# Patient Record
Sex: Female | Born: 2000 | Race: White | Hispanic: No | Marital: Single | Smoking: Never smoker
Health system: Southern US, Community
[De-identification: ages and names within clinical notes are randomized; demographics above are authoritative.]

## PROBLEM LIST (undated history)

## (undated) DIAGNOSIS — D693 Immune thrombocytopenic purpura: Secondary | ICD-10-CM

## (undated) DIAGNOSIS — I471 Supraventricular tachycardia, unspecified: Secondary | ICD-10-CM

## (undated) DIAGNOSIS — N390 Urinary tract infection, site not specified: Secondary | ICD-10-CM

## (undated) HISTORY — DX: Urinary tract infection, site not specified: N39.0

## (undated) HISTORY — PX: CARDIAC ELECTROPHYSIOLOGY STUDY AND ABLATION: SHX1294

---
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
## 2020-10-29 ENCOUNTER — Ambulatory Visit: Payer: No Typology Code available for payment source | Admitting: Dermatology

## 2020-11-26 ENCOUNTER — Ambulatory Visit (INDEPENDENT_AMBULATORY_CARE_PROVIDER_SITE_OTHER): Payer: BC Managed Care – PPO | Admitting: Family Medicine

## 2020-11-26 ENCOUNTER — Encounter: Payer: Self-pay | Admitting: Family Medicine

## 2020-11-26 ENCOUNTER — Other Ambulatory Visit: Payer: Self-pay

## 2020-11-26 VITALS — BP 117/82 | HR 101 | Temp 98.3°F | Resp 16 | Ht 63.0 in | Wt 121.0 lb

## 2020-11-26 DIAGNOSIS — I471 Supraventricular tachycardia: Secondary | ICD-10-CM | POA: Diagnosis not present

## 2020-11-26 DIAGNOSIS — Z862 Personal history of diseases of the blood and blood-forming organs and certain disorders involving the immune mechanism: Secondary | ICD-10-CM

## 2020-11-26 NOTE — Progress Notes (Signed)
New patient visit   Patient: Destiny Phillips   DOB: 12-Nov-2000   19 y.o. Female  MRN: [id number] Visit Date: 11/26/2020  Today's healthcare provider: Megan Mans, MD   Chief Complaint  Patient presents with  . New Patient (Initial Visit)   Subjective    Destiny Phillips is a 20 y.o. female who presents today as a new patient to establish care. 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 She has not had the COVID-vaccine but had COVID in January. 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 She was seen by her dentist 1 week ago when for an oral rash in the roof of her mouth.  She is worried it is ITP.  No easy bruising.  Otherwise feels well.  Patient feels well today with no complaints.   No past medical history on file. No past surgical history on file. Family Status  Relation Name Status  . Mother  Alive  . Father  Alive  . Oneal Grout  (Not Specified)  . MGM  (Not Specified)  . PGF  (Not Specified)   Family History  Problem Relation Age of Onset  . Healthy Mother   . Healthy Father   . Cancer Paternal Uncle   . Breast cancer Maternal Grandmother   . Colon cancer Paternal Grandfather    Social History   Socioeconomic History  . Marital status: Single    Spouse name: Not on file  . Number of children: Not on file  . Years of education: Not on file  . Highest education level: Not on file  Occupational History  . Not on file  Tobacco Use  . Smoking status: Never Smoker  . Smokeless tobacco: Never Used  Substance and Sexual Activity  . Alcohol use: Not on file  . Drug use: Never  . Sexual activity: Not on file  Other Topics Concern  . Not on file  Social History Narrative  . Not on file   Social Determinants of Health   Financial Resource Strain: Not on file  Food Insecurity: Not on file  Transportation Needs: Not on file  Physical Activity: Not on  file  Stress: Not on file  Social Connections: Not on file   Outpatient Medications Prior to Visit  Medication Sig  . acetaminophen (TYLENOL) 325 MG tablet Take 325 mg by mouth every 6 (six) hours as needed for pain.  Marland Kitchen atenolol (TENORMIN) 25 MG tablet Take 1 tablet (25 mg total) by mouth daily. (Patient not taking: Reported on 11/26/2020)   No facility-administered medications prior to visit.   No Known Allergies   There is no immunization history on file for this patient.  Health Maintenance  Topic Date Due  . HPV VACCINES (1 - 2-dose series) Never done  . HIV Screening  Never done  . Hepatitis C Screening  Never done  . TETANUS/TDAP  Never done  . INFLUENZA VACCINE  02/16/2021    Patient Care Team: Maple Hudson., MD as PCP - General (Family Medicine)  Review of Systems  All other systems reviewed and are negative.      Objective    BP 117/82   Pulse (!) 101   Temp 98.3 F (36.8 C)   Resp 16   Ht 5\' 3"  (1.6 m)   Wt 121 lb (54.9 kg)   LMP 11/19/2020  BMI 21.43 kg/m  Physical Exam Vitals reviewed.  Constitutional:      Appearance: Normal appearance.  HENT:     Head: Normocephalic and atraumatic.     Right Ear: External ear normal.     Left Ear: External ear normal.     Nose: Nose normal.     Mouth/Throat:     Mouth: Mucous membranes are moist.     Comments: Mildly erythematous small lesions at the roof of her mouth. Eyes:     General: No scleral icterus.    Conjunctiva/sclera: Conjunctivae normal.  Cardiovascular:     Rate and Rhythm: Normal rate and regular rhythm.     Heart sounds: Normal heart sounds.  Pulmonary:     Breath sounds: Normal breath sounds.  Abdominal:     Palpations: Abdomen is soft.  Skin:    General: Skin is warm and dry.  Neurological:     General: No focal deficit present.     Mental Status: She is alert and oriented to person, place, and time.  Psychiatric:        Mood and Affect: Mood normal.        Behavior:  Behavior normal.        Thought Content: Thought content normal.        Judgment: Judgment normal.      Depression Screen PHQ 2/9 Scores 11/26/2020  PHQ - 2 Score 0  PHQ- 9 Score 4   No results found for any visits on 11/26/20.  Assessment & Plan      1. History of ITP May need referral to hematology if his platelets are low. - CBC with Differential/Platelet  2. SVT (supraventricular tachycardia) (HCC) Asymptomatic.   No follow-ups on file.     I, Megan Mans, MD, have reviewed all documentation for this visit. The documentation on 11/29/20 for the exam, diagnosis, procedures, and orders are all accurate and complete.    Viva Gallaher Wendelyn Breslow, MD  [facility] [phone] (phone) [phone] (fax)  [facility]

## 2020-11-27 LAB — CBC WITH DIFFERENTIAL/PLATELET
Basophils Absolute: 0 10*3/uL (ref 0.0–0.2)
Basos: 1 %
EOS (ABSOLUTE): 0.1 10*3/uL (ref 0.0–0.4)
Eos: 2 %
Hematocrit: 36.5 % (ref 34.0–46.6)
Hemoglobin: 11.7 g/dL (ref 11.1–15.9)
Immature Grans (Abs): 0 10*3/uL (ref 0.0–0.1)
Immature Granulocytes: 0 %
Lymphocytes Absolute: 1.5 10*3/uL (ref 0.7–3.1)
Lymphs: 33 %
MCH: 25 pg — ABNORMAL LOW (ref 26.6–33.0)
MCHC: 32.1 g/dL (ref 31.5–35.7)
MCV: 78 fL — ABNORMAL LOW (ref 79–97)
Monocytes Absolute: 0.4 10*3/uL (ref 0.1–0.9)
Monocytes: 8 %
Neutrophils Absolute: 2.6 10*3/uL (ref 1.4–7.0)
Neutrophils: 56 %
Platelets: 137 10*3/uL — ABNORMAL LOW (ref 150–450)
RBC: 4.68 x10E6/uL (ref 3.77–5.28)
RDW: 14.4 % (ref 11.7–15.4)
WBC: 4.6 10*3/uL (ref 3.4–10.8)

## 2020-12-02 ENCOUNTER — Other Ambulatory Visit: Payer: Self-pay

## 2020-12-02 DIAGNOSIS — Z862 Personal history of diseases of the blood and blood-forming organs and certain disorders involving the immune mechanism: Secondary | ICD-10-CM

## 2020-12-02 MED ORDER — PREDNISONE 10 MG PO TABS: ORAL_TABLET | ORAL | 0 refills | Status: DC

## 2020-12-11 ENCOUNTER — Emergency Department (HOSPITAL_COMMUNITY)
Admission: EM | Admit: 2020-12-11 | Discharge: 2020-12-12 | Disposition: A | Discharge status: Home / Self Care | Payer: BC Managed Care – PPO | Attending: Emergency Medicine | Admitting: Emergency Medicine

## 2020-12-11 ENCOUNTER — Ambulatory Visit: Payer: Self-pay | Admitting: *Deleted

## 2020-12-11 ENCOUNTER — Emergency Department (HOSPITAL_COMMUNITY): Payer: BC Managed Care – PPO

## 2020-12-11 ENCOUNTER — Other Ambulatory Visit: Payer: Self-pay

## 2020-12-11 ENCOUNTER — Encounter (HOSPITAL_COMMUNITY): Payer: Self-pay | Admitting: *Deleted

## 2020-12-11 DIAGNOSIS — R070 Pain in throat: Secondary | ICD-10-CM | POA: Diagnosis not present

## 2020-12-11 DIAGNOSIS — R410 Disorientation, unspecified: Secondary | ICD-10-CM | POA: Diagnosis not present

## 2020-12-11 DIAGNOSIS — Z28310 Unvaccinated for covid-19: Secondary | ICD-10-CM | POA: Insufficient documentation

## 2020-12-11 DIAGNOSIS — R079 Chest pain, unspecified: Secondary | ICD-10-CM

## 2020-12-11 DIAGNOSIS — R0602 Shortness of breath: Secondary | ICD-10-CM | POA: Diagnosis not present

## 2020-12-11 DIAGNOSIS — R0600 Dyspnea, unspecified: Secondary | ICD-10-CM

## 2020-12-11 HISTORY — DX: Supraventricular tachycardia, unspecified: I47.10

## 2020-12-11 HISTORY — DX: Supraventricular tachycardia: I47.1

## 2020-12-11 HISTORY — DX: Immune thrombocytopenic purpura: D69.3

## 2020-12-11 LAB — BASIC METABOLIC PANEL
Anion gap: 7 (ref 5–15)
BUN: 15 mg/dL (ref 6–20)
CO2: 28 mmol/L (ref 22–32)
Calcium: 9.4 mg/dL (ref 8.9–10.3)
Chloride: 99 mmol/L (ref 98–111)
Creatinine, Ser: 0.75 mg/dL (ref 0.44–1.00)
GFR, Estimated: >60 mL/min (ref >60)
Glucose, Bld: 90 mg/dL (ref 70–99)
Potassium: 3.8 mmol/L (ref 3.5–5.1)
Sodium: 134 mmol/L — ABNORMAL LOW (ref 135–145)

## 2020-12-11 LAB — CBC WITH DIFFERENTIAL/PLATELET
Abs Immature Granulocytes: 0.16 10*3/uL — ABNORMAL HIGH (ref 0.00–0.07)
Basophils Absolute: 0 10*3/uL (ref 0.0–0.1)
Basophils Relative: 1 %
Eosinophils Absolute: 0.1 10*3/uL (ref 0.0–0.5)
Eosinophils Relative: 1 %
HCT: 39.1 % (ref 36.0–46.0)
Hemoglobin: 12.4 g/dL (ref 12.0–15.0)
Immature Granulocytes: 2 %
Lymphocytes Relative: 36 %
Lymphs Abs: 3.1 10*3/uL (ref 0.7–4.0)
MCH: 25.4 pg — ABNORMAL LOW (ref 26.0–34.0)
MCHC: 31.7 g/dL (ref 30.0–36.0)
MCV: 80 fL (ref 80.0–100.0)
Monocytes Absolute: 0.7 10*3/uL (ref 0.1–1.0)
Monocytes Relative: 8 %
Neutro Abs: 4.6 10*3/uL (ref 1.7–7.7)
Neutrophils Relative %: 52 %
Platelets: 172 10*3/uL (ref 150–400)
RBC: 4.89 MIL/uL (ref 3.87–5.11)
RDW: 14.4 % (ref 11.5–15.5)
WBC: 8.6 10*3/uL (ref 4.0–10.5)
nRBC: 0 % (ref 0.0–0.2)

## 2020-12-11 LAB — I-STAT BETA HCG BLOOD, ED (MC, WL, AP ONLY): I-stat hCG, quantitative: <5 m[IU]/mL (ref <5)

## 2020-12-11 LAB — TROPONIN I (HIGH SENSITIVITY)
Troponin I (High Sensitivity): 3 ng/L (ref <18)
Troponin I (High Sensitivity): 3 ng/L (ref <18)

## 2020-12-11 LAB — D-DIMER, QUANTITATIVE: D-Dimer, Quant: <0.27 ug/mL-FEU (ref 0.00–0.50)

## 2020-12-11 NOTE — ED Provider Notes (Signed)
Emergency Medicine Provider Triage Evaluation Note  Destiny Phillips , a 20 y.o. female  was evaluated in triage.  Pt complains of shortness of breath, generalized weakness and chest pressure that started last night. She is currently on prednisone for itp. She has a h/o cardiac ablation for svt.   Denies leg pain/swelling, hemoptysis, recent surgery, recent long travel, hormone use, personal hx of cancer, or hx of DVT/PE.   Review of Systems  Positive: Sob, generalized weakness, chest pressure Negative: Cough, pleuritic pain  Physical Exam  BP (!) 127/97   Pulse (!) 109   Temp 98.6 F (37 C)   Resp 16   LMP 11/19/2020   SpO2 99%  Gen:   Awake, no distress   Resp:  Normal effort  MSK:   Moves extremities without difficulty  Other:  Tachycardic, regular rhythm  Medical Decision Making  Medically screening exam initiated at 7:00 PM.  Appropriate orders placed.  Roanne Haye was informed that the remainder of the evaluation will be completed by another provider, this initial triage assessment does not replace that evaluation, and the importance of remaining in the ED until their evaluation is complete.    Rayne Du 12/11/20 1904    Margarita Grizzle, MD 12/11/20 2241

## 2020-12-11 NOTE — Discharge Instructions (Addendum)
Your hemoglobin is normal today.  Your platelets are 170,000 (normal).  Chest x-Violia Knopf does not show infection or other causes of shortness of breath.  Please return if you are worse at any time.  Follow up with your doctor asap.

## 2020-12-11 NOTE — Telephone Encounter (Signed)
Pt reports SOB, onset last night. Reports "Feels like an allergic reaction but I don't know what to." HAs been taking prednisone taper.Also reports difficulty swallowing. Denies swelling, no fever. States SOB at rest, unable to lie flat last night. Advised ED, states will follow disposition, has driver.   Reason for Disposition . [1] MODERATE difficulty breathing (e.g., speaks in phrases, SOB even at rest, pulse 100-120) AND [2] NEW-onset or WORSE than normal  Answer Assessment - Initial Assessment Questions 1. RESPIRATORY STATUS: "Describe your breathing?" (e.g., wheezing, shortness of breath, unable to speak, severe coughing)      SOB 2. ONSET: "When did this breathing problem begin?"      LAst night 3. PATTERN "Does the difficult breathing come and go, or has it been constant since it started?"      constant 4. SEVERITY: "How bad is your breathing?" (e.g., mild, moderate, severe)    - MILD: No SOB at rest, mild SOB with walking, speaks normally in sentences, can lie down, no retractions, pulse < 100.    - MODERATE: SOB at rest, SOB with minimal exertion and prefers to sit, cannot lie down flat, speaks in phrases, mild retractions, audible wheezing, pulse 100-120.    - SEVERE: Very SOB at rest, speaks in single words, struggling to breathe, sitting hunched forward, retractions, pulse > 120      Moderate 5. RECURRENT SYMPTOM: "Have you had difficulty breathing before?" If Yes, ask: "When was the last time?" and "What happened that time?"       6. CARDIAC HISTORY: "Do you have any history of heart disease?" (e.g., heart attack, angina, bypass surgery, angioplasty)      *No Answer* 7. LUNG HISTORY: "Do you have any history of lung disease?"  (e.g., pulmonary embolus, asthma, emphysema)     *No Answer* 8. CAUSE: "What do you think is causing the breathing problem?"      "Allergic reaction but not sure what to" 9. OTHER SYMPTOMS: "Do you have any other symptoms? (e.g., dizziness, runny nose,  cough, chest pain, fever)     no 10. O2 SATURATION MONITOR:  "Do you use an oxygen saturation monitor (pulse oximeter) at home?" If Yes, "What is your reading (oxygen level) today?" "What is your usual oxygen saturation reading?" (e.g., 95%)       NA  Protocols used: BREATHING DIFFICULTY-A-AH

## 2020-12-11 NOTE — ED Provider Notes (Signed)
[facility] EMERGENCY DEPARTMENT Provider Note   CSN: [id number] Arrival date & time: 12/11/20  1839     History Chief Complaint  Patient presents with  . Shortness of Breath  . Chest Pain    Destiny Phillips is a 20 y.o. female.  HPI    20 year old female history of ITP, SVT, anxiety presents today complaining of shortness of breath that began last night around 9 PM.   states she had some feeling of fullness in her throat that was associated with this.  She feels like it may be her anxiety but has never really had dyspnea like this before.  She had COVID in mid January and has not been vaccinated.  She has not noted any fever, chills, or coughing.  She currently is still on prednisone for ITP.  She has had a cardiac ablation for SVT.  She denies any leg swelling, history of DVT or PE, hemoptysis, recent immobilization or hormone use. Past Medical History:  Diagnosis Date  . Idiopathic thrombocytopenic purpura (ITP) (HCC)   . SVT (supraventricular tachycardia) (HCC)     There are no problems to display for this patient.   History reviewed. No pertinent surgical history.   OB History   No obstetric history on file.     Family History  Problem Relation Age of Onset  . Healthy Mother   . Healthy Father   . Cancer Paternal Uncle   . Breast cancer Maternal Grandmother   . Colon cancer Paternal Grandfather     Social History   Tobacco Use  . Smoking status: Never Smoker  . Smokeless tobacco: Never Used  Substance Use Topics  . Alcohol use: Never  . Drug use: Never    Home Medications Prior to Admission medications   Medication Sig Start Date End Date Taking? Authorizing Provider  predniSONE (DELTASONE) 10 MG tablet 12 d taper, begin with 60 mg and decrease by 10 mg every other day Patient taking differently: Take 10 mg by mouth See admin instructions. 12 d taper, begin with 60 mg and decrease by 10 mg every other day 12/02/20  Yes Maple Hudson., MD  acetaminophen (TYLENOL) 325 MG tablet Take 325 mg by mouth every 6 (six) hours as needed for pain. Patient not taking: Reported on 12/11/2020    [provider]    Allergies    Patient has no known allergies.  Review of Systems   Review of Systems  All other systems reviewed and are negative.   Physical Exam Updated Vital Signs BP 121/85 (BP Location: Right Arm)   Pulse 98   Temp 98.2 F (36.8 C) (Oral)   Resp 14   LMP 11/19/2020   SpO2 99%   Physical Exam Vitals and nursing note reviewed.  Constitutional:      General: She is not in acute distress.    Appearance: She is well-developed. She is not ill-appearing.  HENT:     Head: Normocephalic.     Mouth/Throat:     Mouth: Mucous membranes are moist.  Eyes:     Pupils: Pupils are equal, round, and reactive to light.  Cardiovascular:     Rate and Rhythm: Normal rate and regular rhythm.  Pulmonary:     Effort: Pulmonary effort is normal.     Breath sounds: Normal breath sounds.  Abdominal:     General: Bowel sounds are normal.     Palpations: Abdomen is soft.  Musculoskeletal:  General: Normal range of motion.     Cervical back: Normal range of motion.  Skin:    General: Skin is warm and dry.     Capillary Refill: Capillary refill takes less than 2 seconds.  Neurological:     General: No focal deficit present.     Mental Status: She is alert. She is disoriented.  Psychiatric:        Mood and Affect: Mood normal.     ED Results / Procedures / Treatments   Labs (all labs ordered are listed, but only abnormal results are displayed) Labs Reviewed  CBC WITH DIFFERENTIAL/PLATELET - Abnormal; Notable for the following components:      Result Value   MCH 25.4 (*)    Abs Immature Granulocytes 0.16 (*)    All other components within normal limits  BASIC METABOLIC PANEL - Abnormal; Notable for the following components:   Sodium 134 (*)    All other components within normal limits  D-DIMER,  QUANTITATIVE  I-STAT BETA HCG BLOOD, ED ([facility], [facility], [facility] ONLY)  TROPONIN I (HIGH SENSITIVITY)  TROPONIN I (HIGH SENSITIVITY)    EKG EKG Interpretation  Date/Time:  Thursday Dec 11 2020 18:56:56 EDT Ventricular Rate:  102 PR Interval:  120 QRS Duration: 80 QT Interval:  324 QTC Calculation: 422 R Axis:   73 Text Interpretation: Sinus tachycardia Possible Anterior infarct , age undetermined Abnormal ECG Confirmed by Margarita Grizzle [phone]) on 12/11/2020 10:44:41 PM   Radiology DG Chest 2 View  Result Date: 12/11/2020 CLINICAL DATA:  Shortness of breath.  Chest heaviness. EXAM: CHEST - 2 VIEW COMPARISON:  07/17/2013 FINDINGS: The cardiomediastinal contours are normal. Mild bronchial thickening is new from prior. Pulmonary vasculature is normal. No consolidation, pleural effusion, or pneumothorax. Thoracolumbar scoliosis with mild progression. No acute osseous abnormalities are seen. IMPRESSION: 1. Mild bronchial thickening, new from prior, suggesting asthma or bronchitis. 2. Thoracolumbar scoliosis. Electronically Signed   By: Narda Rutherford M.D.   On: 12/11/2020 19:57    Procedures Procedures   Medications Ordered in ED Medications - No data to display  ED Course  I have reviewed the triage vital signs and the nursing notes.  Pertinent labs & imaging results that were available during my care of the patient were reviewed by me and considered in my medical decision making (see chart for details).    MDM Rules/Calculators/A&P                         20 year old female presents today with dyspnea.  She has history of ITP with normal platelets here in ED.  CBC and chemistry obtained without acute abnormality noted.  Patient borderline tachycardic and EKG with mild tachycardia.  Chest x-Afrika Brick is clear although mild bronchial thickening is noted by radiologist.  Patient does not have symptoms consistent with this with no wheezing and no recent history of infectious symptoms.  Given normal EKG,  chest x-Bernal Luhman, and labs, will add D-dimer.  If this is normal patient be cleared for discharge Discussed with Dr. Preston Fleeting who will d/c patient if d-dimer normal.   Final Clinical Impression(s) / ED Diagnoses Final diagnoses:  Dyspnea, unspecified type    Rx / DC Orders ED Discharge Orders    None       Margarita Grizzle, MD 12/11/20 2303

## 2020-12-11 NOTE — ED Triage Notes (Signed)
Pt reports having sob last night with chest pressure and generalized weakness. Denies cough or palpitations. No acute distress noted at triage. Recently taking prednisone for ITP.

## 2020-12-11 NOTE — ED Provider Notes (Signed)
Care assumed from Dr. Rosalia Hammers, patient with shortness of breath, borderline tachycardia with D-dimer pending.  D-dimer is normal.  She is discharged with instructions to follow-up with PCP.  Return precautions discussed.  Results for orders placed or performed during the hospital encounter of 12/11/20  CBC with Differential  Result Value Ref Range   WBC 8.6 4.0 - 10.5 K/uL   RBC 4.89 3.87 - 5.11 MIL/uL   Hemoglobin 12.4 12.0 - 15.0 g/dL   HCT 41.9 62.2 - 29.7 %   MCV 80.0 80.0 - 100.0 fL   MCH 25.4 (L) 26.0 - 34.0 pg   MCHC 31.7 30.0 - 36.0 g/dL   RDW 98.9 21.1 - 94.1 %   Platelets 172 150 - 400 K/uL   nRBC 0.0 0.0 - 0.2 %   Neutrophils Relative % 52 %   Neutro Abs 4.6 1.7 - 7.7 K/uL   Lymphocytes Relative 36 %   Lymphs Abs 3.1 0.7 - 4.0 K/uL   Monocytes Relative 8 %   Monocytes Absolute 0.7 0.1 - 1.0 K/uL   Eosinophils Relative 1 %   Eosinophils Absolute 0.1 0.0 - 0.5 K/uL   Basophils Relative 1 %   Basophils Absolute 0.0 0.0 - 0.1 K/uL   Immature Granulocytes 2 %   Abs Immature Granulocytes 0.16 (H) 0.00 - 0.07 K/uL  Basic metabolic panel  Result Value Ref Range   Sodium 134 (L) 135 - 145 mmol/L   Potassium 3.8 3.5 - 5.1 mmol/L   Chloride 99 98 - 111 mmol/L   CO2 28 22 - 32 mmol/L   Glucose, Bld 90 70 - 99 mg/dL   BUN 15 6 - 20 mg/dL   Creatinine, Ser 7.40 0.44 - 1.00 mg/dL   Calcium 9.4 8.9 - 81.4 mg/dL   GFR, Estimated >48 >18 mL/min   Anion gap 7 5 - 15  D-dimer, quantitative  Result Value Ref Range   D-Dimer, Quant <0.27 0.00 - 0.50 ug/mL-FEU  I-Stat beta hCG blood, ED  Result Value Ref Range   I-stat hCG, quantitative <5.0 <5 mIU/mL   Comment 3          Troponin I (High Sensitivity)  Result Value Ref Range   Troponin I (High Sensitivity) 3 <18 ng/L  Troponin I (High Sensitivity)  Result Value Ref Range   Troponin I (High Sensitivity) 3 <18 ng/L   DG Chest 2 View  Result Date: 12/11/2020 CLINICAL DATA:  Shortness of breath.  Chest heaviness. EXAM: CHEST - 2  VIEW COMPARISON:  07/17/2013 FINDINGS: The cardiomediastinal contours are normal. Mild bronchial thickening is new from prior. Pulmonary vasculature is normal. No consolidation, pleural effusion, or pneumothorax. Thoracolumbar scoliosis with mild progression. No acute osseous abnormalities are seen. IMPRESSION: 1. Mild bronchial thickening, new from prior, suggesting asthma or bronchitis. 2. Thoracolumbar scoliosis. Electronically Signed   By: Narda Rutherford M.D.   On: 12/11/2020 19:57     Dione Booze, MD 12/12/20 [phone]

## 2020-12-12 NOTE — Telephone Encounter (Signed)
Noted  

## 2020-12-25 ENCOUNTER — Ambulatory Visit (INDEPENDENT_AMBULATORY_CARE_PROVIDER_SITE_OTHER): Payer: BC Managed Care – PPO | Admitting: Dermatology

## 2020-12-25 ENCOUNTER — Encounter: Payer: Self-pay | Admitting: Dermatology

## 2020-12-25 ENCOUNTER — Other Ambulatory Visit: Payer: Self-pay

## 2020-12-25 DIAGNOSIS — L281 Prurigo nodularis: Secondary | ICD-10-CM

## 2020-12-25 DIAGNOSIS — L308 Other specified dermatitis: Secondary | ICD-10-CM | POA: Diagnosis not present

## 2020-12-25 MED ORDER — CLOBETASOL PROPIONATE 0.05 % EX OINT: 1.0000 "application " | TOPICAL_OINTMENT | Freq: Two times a day (BID) | CUTANEOUS | 1 refills | Status: DC

## 2020-12-25 NOTE — Progress Notes (Signed)
   New Patient Visit  Subjective  Destiny Phillips is a 20 y.o. female who presents for the following: Eczema (New patient here today for eczema at legs. Patient has had eczema her entire life and advises she has used rx creams in the past that did not work but she does not remember what they were. ).  Patient did have biopsy done at [facility] that showed eczema.  Patient has had allergy testing in the past. She eats gluten free and is vegetarian.   The following portions of the chart were reviewed this encounter and updated as appropriate:   Tobacco  Allergies  Meds  Problems  Med Hx  Surg Hx  Fam Hx       Review of Systems:  No other skin or systemic complaints except as noted in HPI or Assessment and Plan.  Objective  Well appearing patient in no apparent distress; mood and affect are within normal limits.  A focused examination was performed including legs, arms. Relevant physical exam findings are noted in the Assessment and Plan.  Right Thigh - Anterior Lichenified nodules and plaques  Bilateral Thighs Thin scaly erythematous patches   Assessment & Plan  Prurigo nodularis Right Thigh - Anterior  Intralesional steroid injection side effects were reviewed including thinning of the skin and discoloration, such as redness, lightening or darkening.   Intralesional injection - Right Thigh - Anterior Location: bilateral thighs  Informed Consent: Discussed risks (infection, pain, bleeding, bruising, thinning of the skin, loss of skin pigment, lack of resolution, and recurrence of lesion) and benefits of the procedure, as well as the alternatives. Informed consent was obtained. Preparation: The area was prepared a standard fashion.  Procedure Details: An intralesional injection was performed with Kenalog 5 mg/cc. 1 cc in total were injected.  Total number of injections: > 12  Plan: The patient was instructed on post-op care. Recommend OTC analgesia as needed  for pain.   Other eczema Bilateral Thighs  Chronic condition with duration of many years. Condition is bothersome to patient. Currently flared.  Start clobetasol 0.05% ointment twice daily for up to 2 weeks. Avoid applying to face, groin, and axilla. Use as directed. Risk of skin atrophy with long-term use reviewed.   Topical steroids (such as triamcinolone, fluocinolone, fluocinonide, mometasone, clobetasol, halobetasol, betamethasone, hydrocortisone) can cause thinning and lightening of the skin if they are used for too long in the same area. Your physician has selected the right strength medicine for your problem and area affected on the body. Please use your medication only as directed by your physician to prevent side effects.   Atopic dermatitis (eczema) is a chronic, relapsing, pruritic condition that can significantly affect quality of life. It is often associated with allergic rhinitis and/or asthma and can require treatment with topical medications, phototherapy, or in severe cases a biologic medication called Dupixent in older children and adults.     Related Medications clobetasol ointment (TEMOVATE) 0.05 % Apply 1 application topically 2 (two) times daily. Apply twice daily for up to 2 weeks as needed. Avoid applying to face, groin, and axilla. Use as directed. Risk of skin atrophy with long-term use reviewed.   Return for 3-6 weeks for prurigo nodularis/ILK injections.  Anise Salvo, RMA, am acting as scribe for Darden Dates, MD .  Documentation: I have reviewed the above documentation for accuracy and completeness, and I agree with the above.  Darden Dates, MD

## 2020-12-25 NOTE — Patient Instructions (Addendum)
Start clobetasol 0.05% ointment twice daily for up to 2 weeks. Avoid applying to face, groin, and axilla. Use as directed. Risk of skin atrophy with long-term use reviewed.   Topical steroids (such as triamcinolone, fluocinolone, fluocinonide, mometasone, clobetasol, halobetasol, betamethasone, hydrocortisone) can cause thinning and lightening of the skin if they are used for too long in the same area. Your physician has selected the right strength medicine for your problem and area affected on the body. Please use your medication only as directed by your physician to prevent side effects.   Intralesional steroid injection side effects were reviewed including thinning of the skin and discoloration, such as redness, lightening or darkening.  Recommend OTC Gold Bond Rapid Relief Anti-Itch cream (pramoxine + menthol) up to 3 times per day to areas that are itchy.  If you have any questions or concerns for your doctor, please call our main line at [phone] and press option 4 to reach your doctor's medical assistant. If no one answers, please leave a voicemail as directed and we will return your call as soon as possible. Messages left after 4 pm will be answered the following business day.   You may also send [place] a message via MyChart. We typically respond to MyChart messages within 1-2 business days.  For prescription refills, please ask your pharmacy to contact our office. Our fax number is [phone].  If you have an urgent issue when the clinic is closed that cannot wait until the next business day, you can page your doctor at the number below.    Please note that while we do our best to be available for urgent issues outside of office hours, we are not available 24/7.   If you have an urgent issue and are unable to reach [place], you may choose to seek medical care at your doctor's office, retail clinic, urgent care center, or emergency room.  If you have a medical emergency, please immediately  call 911 or go to the emergency department.  Pager Numbers  - Dr. Gwen Pounds: [phone]  - Dr. Neale Burly: [phone]  - Dr. Roseanne Reno: [phone]  In the event of inclement weather, please call our main line at [phone] for an update on the status of any delays or closures.  Dermatology Medication Tips: Please keep the boxes that topical medications come in in order to help keep track of the instructions about where and how to use these. Pharmacies typically print the medication instructions only on the boxes and not directly on the medication tubes.   If your medication is too expensive, please contact our office at [phone] option 4 or send [place] a message through MyChart.   We are unable to tell what your co-pay for medications will be in advance as this is different depending on your insurance coverage. However, we may be able to find a substitute medication at lower cost or fill out paperwork to get insurance to cover a needed medication.   If a prior authorization is required to get your medication covered by your insurance company, please allow [place] 1-2 business days to complete this process.  Drug prices often vary depending on where the prescription is filled and some pharmacies may offer cheaper prices.  The website www.goodrx.com contains coupons for medications through different pharmacies. The prices here do not account for what the cost may be with help from insurance (it may be cheaper with your insurance), but the website can give you the price if you did not use any insurance.  -  You can print the associated coupon and take it with your prescription to the pharmacy.  - You may also stop by our office during regular business hours and pick up a GoodRx coupon card.  - If you need your prescription sent electronically to a different pharmacy, notify our office through [facility] or by phone at [phone] option 4.

## 2021-01-20 ENCOUNTER — Ambulatory Visit (INDEPENDENT_AMBULATORY_CARE_PROVIDER_SITE_OTHER): Payer: BC Managed Care – PPO | Admitting: Dermatology

## 2021-01-20 ENCOUNTER — Other Ambulatory Visit: Payer: Self-pay

## 2021-01-20 DIAGNOSIS — L280 Lichen simplex chronicus: Secondary | ICD-10-CM

## 2021-01-20 DIAGNOSIS — L281 Prurigo nodularis: Secondary | ICD-10-CM | POA: Diagnosis not present

## 2021-01-20 DIAGNOSIS — L905 Scar conditions and fibrosis of skin: Secondary | ICD-10-CM

## 2021-01-20 DIAGNOSIS — L209 Atopic dermatitis, unspecified: Secondary | ICD-10-CM

## 2021-01-20 NOTE — Progress Notes (Signed)
   Follow-Up Visit   Subjective  Destiny Phillips is a 20 y.o. female who presents for the following: prurigo nodularis  (B/L leg - improved with ILK injections ) and eczema (B/L thighs - condition has improved and patient hasn't needed to use the Clobetasol 0.05% ointment. ).  The following portions of the chart were reviewed this encounter and updated as appropriate:   Tobacco  Allergies  Meds  Problems  Med Hx  Surg Hx  Fam Hx     Review of Systems:  No other skin or systemic complaints except as noted in HPI or Assessment and Plan.  Objective  Well appearing patient in no apparent distress; mood and affect are within normal limits.  A focused examination was performed including the B/L leg. Relevant physical exam findings are noted in the Assessment and Plan.  B/L thigh Lichenified firm papules and nodules  B/L thigh Scaly erythematous patches  B/L thigh Hyperpigmented and hypopigmented macules and slightly atrophic plaques.  Assessment & Plan  Prurigo nodularis B/L thigh  Intralesional injection - B/L thigh Location: R thigh   Informed Consent: Discussed risks (infection, pain, bleeding, bruising, thinning of the skin, loss of skin pigment, lack of resolution, and recurrence of lesion) and benefits of the procedure, as well as the alternatives. Informed consent was obtained. Preparation: The area was prepared a standard fashion.  Procedure Details: An intralesional injection was performed with Kenalog 3.3 mg/cc. 0.4 cc in total were injected.  Total number of injections: 8  Plan: The patient was instructed on post-op care. Recommend OTC analgesia as needed for pain.   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 It is often associated with allergic rhinitis and/or asthma and can require  treatment with topical medications, phototherapy, or in severe cases a biologic medication called Dupixent in children and adults.   Chronic condition with duration over one year. Condition is bothersome to patient. Not currently at goal.  Continue Clobetasol 0.05% ointment to aa's QD-BID PRN flares. Monitor for lightening. Avoid using for more than 2 weeks. Avoid applying to face, groin, and axilla. Use as directed. Risk of skin atrophy with long-term use reviewed.    Topical steroids (such as triamcinolone, fluocinolone, fluocinonide, mometasone, clobetasol, halobetasol, betamethasone, hydrocortisone) can cause thinning and lightening of the skin if they are used for too long in the same area. Your physician has selected the right strength medicine for your problem and area affected on the body. Please use your medication only as directed by your physician to prevent side effects.    Scarring B/L thigh  And post-inflammatory pigmentary changes  Recommend Serica moisturizing scar formula cream every night or [facility] brand or Mederma silicone scar sheet every night for the first year after a scar appears to help with scar remodeling if desired. Scars remodel on their own for a full year.  Recommend daily broad spectrum sunscreen SPF 30+ to sun-exposed areas, reapply every 2 hours as needed.   Return in about 3 months (around 04/22/2021).  Maylene Roes, CMA, am acting as scribe for Darden Dates, MD .   Documentation: I have reviewed the above documentation for accuracy and completeness, and I agree with the above.  Darden Dates, MD

## 2021-01-20 NOTE — Patient Instructions (Addendum)
If you have any questions or concerns for your doctor, please call our main line at [phone] and press option 4 to reach your doctor's medical assistant. If no one answers, please leave a voicemail as directed and we will return your call as soon as possible. Messages left after 4 pm will be answered the following business day.   You may also send us a message via MyChart. We typically respond to MyChart messages within 1-2 business days.  For prescription refills, please ask your pharmacy to contact our office. Our fax number is [phone].  If you have an urgent issue when the clinic is closed that cannot wait until the next business day, you can page your doctor at the number below.    Please note that while we do our best to be available for urgent issues outside of office hours, we are not available 24/7.   If you have an urgent issue and are unable to reach us, you may choose to seek medical care at your doctor's office, retail clinic, urgent care center, or emergency room.  If you have a medical emergency, please immediately call 911 or go to the emergency department.  Pager Numbers  - Dr. Kowalski: [phone]  - Dr. Moye: [phone]  - Dr. Stewart: [phone]  In the event of inclement weather, please call our main line at [phone] for an update on the status of any delays or closures.  Dermatology Medication Tips: Please keep the boxes that topical medications come in in order to help keep track of the instructions about where and how to use these. Pharmacies typically print the medication instructions only on the boxes and not directly on the medication tubes.   If your medication is too expensive, please contact our office at [phone] option 4 or send us a message through MyChart.   We are unable to tell what your co-pay for medications will be in advance as this is different depending on your insurance coverage. However, we may be able to find a substitute  medication at lower cost or fill out paperwork to get insurance to cover a needed medication.   If a prior authorization is required to get your medication covered by your insurance company, please allow us 1-2 business days to complete this process.  Drug prices often vary depending on where the prescription is filled and some pharmacies may offer cheaper prices.  The website www.goodrx.com contains coupons for medications through different pharmacies. The prices here do not account for what the cost may be with help from insurance (it may be cheaper with your insurance), but the website can give you the price if you did not use any insurance.  - You can print the associated coupon and take it with your prescription to the pharmacy.  - You may also stop by our office during regular business hours and pick up a GoodRx coupon card.  - If you need your prescription sent electronically to a different pharmacy, notify our office through [place] MyChart or by phone at [phone] option 4.  Recommend Serica moisturizing scar formula cream every night or [facility] brand or Mederma silicone scar sheet every night for the first year after a scar appears to help with scar remodeling if desired. Scars remodel on their own for a full year.  

## 2021-02-02 ENCOUNTER — Encounter: Payer: Self-pay | Admitting: Dermatology

## 2021-04-13 ENCOUNTER — Encounter: Payer: Self-pay | Admitting: Family Medicine

## 2021-04-13 ENCOUNTER — Ambulatory Visit (INDEPENDENT_AMBULATORY_CARE_PROVIDER_SITE_OTHER): Payer: BC Managed Care – PPO | Admitting: Family Medicine

## 2021-04-13 ENCOUNTER — Other Ambulatory Visit: Payer: Self-pay

## 2021-04-13 VITALS — BP 116/70 | HR 100 | Temp 99.1°F | Resp 16 | Ht 63.0 in | Wt 117.0 lb

## 2021-04-13 DIAGNOSIS — R059 Cough, unspecified: Secondary | ICD-10-CM | POA: Diagnosis not present

## 2021-04-13 DIAGNOSIS — J189 Pneumonia, unspecified organism: Secondary | ICD-10-CM | POA: Diagnosis not present

## 2021-04-13 MED ORDER — AZITHROMYCIN 250 MG PO TABS: ORAL_TABLET | ORAL | 0 refills | Status: AC

## 2021-04-13 MED ORDER — CETIRIZINE HCL 10 MG PO TABS: 10.0000 mg | ORAL_TABLET | Freq: Every day | ORAL | 11 refills | Status: DC

## 2021-04-13 NOTE — Progress Notes (Signed)
Established patient visit   Patient: Destiny Phillips   DOB: May 24, 2001   20 y.o. Female  MRN: [id number] Visit Date: 04/13/2021  Today's healthcare provider: Megan Mans, MD   Chief Complaint  Patient presents with   Sinusitis    Subjective    Sinusitis The current episode started 1 to 4 weeks ago (about 3 weeks). The problem is unchanged. There has been no fever. The pain is mild. Associated symptoms include congestion, coughing, ear pain, headaches, sinus pressure and a sore throat. Pertinent negatives include no chills or shortness of breath. Past treatments include acetaminophen. The treatment provided mild relief.   She has had 4 negative COVID test.  She has had a second worsening of her cough over the past week.  She also has a lot of nasal congestion.    Medications: Outpatient Medications Prior to Visit  Medication Sig   clobetasol ointment (TEMOVATE) 0.05 % Apply 1 application topically 2 (two) times daily. Apply twice daily for up to 2 weeks as needed. Avoid applying to face, groin, and axilla. Use as directed. Risk of skin atrophy with long-term use reviewed. (Patient not taking: No sig reported)   predniSONE (DELTASONE) 10 MG tablet 12 d taper, begin with 60 mg and decrease by 10 mg every other day (Patient not taking: No sig reported)   No facility-administered medications prior to visit.    Review of Systems  Constitutional:  Negative for appetite change, chills, fatigue and fever.  HENT:  Positive for congestion, ear pain, sinus pressure and sore throat.   Respiratory:  Positive for cough. Negative for chest tightness and shortness of breath.   Cardiovascular:  Negative for chest pain and palpitations.  Gastrointestinal:  Negative for abdominal pain, nausea and vomiting.  Neurological:  Positive for headaches. Negative for dizziness and weakness.       Objective    BP 116/70   Pulse 100   Temp 99.1 F (37.3 C)   Resp 16   Ht 5\' 3"  (1.6 m)   Wt  117 lb (53.1 kg)   SpO2 99%   BMI 20.73 kg/m  Wt Readings from Last 3 Encounters:  04/13/21 117 lb (53.1 kg)  11/26/20 121 lb (54.9 kg)  11/25/12 76 lb 14.4 oz (34.9 kg) (7 %, Z= -1.46)*   * Growth percentiles are based on CDC (Girls, 2-20 Years) data.      Physical Exam Vitals reviewed.  Constitutional:      General: She is not in acute distress.    Appearance: She is well-developed.  HENT:     Head: Normocephalic and atraumatic.     Right Ear: Hearing, tympanic membrane and external ear normal.     Left Ear: Hearing, tympanic membrane and external ear normal.     Nose: Nose normal.     Mouth/Throat:     Pharynx: Oropharynx is clear.  Eyes:     General: Lids are normal. No scleral icterus.       Right eye: No discharge.        Left eye: No discharge.     Conjunctiva/sclera: Conjunctivae normal.  Cardiovascular:     Rate and Rhythm: Normal rate and regular rhythm.     Heart sounds: Normal heart sounds.  Pulmonary:     Effort: Pulmonary effort is normal. No respiratory distress.     Breath sounds: Normal breath sounds.  Skin:    Findings: No lesion or rash.  Neurological:  General: No focal deficit present.     Mental Status: She is alert and oriented to person, place, and time.  Psychiatric:        Mood and Affect: Mood normal.        Speech: Speech normal.        Behavior: Behavior normal.        Thought Content: Thought content normal.        Judgment: Judgment normal.      No results found for any visits on 04/13/21.  Assessment & Plan     1. Walking pneumonia It was Z-Pak. - azithromycin (ZITHROMAX) 250 MG tablet; Take 2 tablets on day 1, then 1 tablet daily on days 2 through 5  Dispense: 6 tablet; Refill: 0 - cetirizine (ZYRTEC) 10 MG tablet; Take 1 tablet (10 mg total) by mouth daily.  Dispense: 30 tablet; Refill: 11  2. Cough Could be some allergies involved.  Add Zyrtec.  Consider prednisone over about 5 days.   No follow-ups on file.       I, Megan Mans, MD, have reviewed all documentation for this visit. The documentation on 04/16/21 for the exam, diagnosis, procedures, and orders are all accurate and complete.    Kalayna Noy Wendelyn Breslow, MD  [facility] [phone] (phone) [phone] (fax)  [facility]

## 2021-04-23 ENCOUNTER — Ambulatory Visit (INDEPENDENT_AMBULATORY_CARE_PROVIDER_SITE_OTHER): Payer: BC Managed Care – PPO | Admitting: Dermatology

## 2021-04-23 ENCOUNTER — Other Ambulatory Visit: Payer: Self-pay

## 2021-04-23 DIAGNOSIS — L209 Atopic dermatitis, unspecified: Secondary | ICD-10-CM

## 2021-04-23 DIAGNOSIS — L280 Lichen simplex chronicus: Secondary | ICD-10-CM | POA: Diagnosis not present

## 2021-04-23 DIAGNOSIS — L281 Prurigo nodularis: Secondary | ICD-10-CM

## 2021-04-23 MED ORDER — TACROLIMUS 0.1 % EX OINT: TOPICAL_OINTMENT | Freq: Two times a day (BID) | CUTANEOUS | 1 refills | Status: DC

## 2021-04-23 MED ORDER — OPZELURA 1.5 % EX CREA: 1.0000 "application " | TOPICAL_CREAM | Freq: Two times a day (BID) | CUTANEOUS | 1 refills | Status: DC

## 2021-04-23 NOTE — Patient Instructions (Addendum)
Start Opzelura twice daily to affected areas. Start tacrolimus to affected areas following Opzelura twice daily.   Recommend Serica moisturizing scar formula cream every night or [facility] brand or Mederma silicone scar sheet every night for the first year after a scar appears to help with scar remodeling if desired. Scars remodel on their own for a full year.  If you have any questions or concerns for your doctor, please call our main line at [phone] and press option 4 to reach your doctor's medical assistant. If no one answers, please leave a voicemail as directed and we will return your call as soon as possible. Messages left after 4 pm will be answered the following business day.   You may also send [place] a message via MyChart. We typically respond to MyChart messages within 1-2 business days.  For prescription refills, please ask your pharmacy to contact our office. Our fax number is [phone].  If you have an urgent issue when the clinic is closed that cannot wait until the next business day, you can page your doctor at the number below.    Please note that while we do our best to be available for urgent issues outside of office hours, we are not available 24/7.   If you have an urgent issue and are unable to reach [place], you may choose to seek medical care at your doctor's office, retail clinic, urgent care center, or emergency room.  If you have a medical emergency, please immediately call 911 or go to the emergency department.  Pager Numbers  - Dr. Gwen Pounds: [phone]  - Dr. Neale Burly: [phone]  - Dr. Roseanne Reno: [phone]  In the event of inclement weather, please call our main line at [phone] for an update on the status of any delays or closures.  Dermatology Medication Tips: Please keep the boxes that topical medications come in in order to help keep track of the instructions about where and how to use these. Pharmacies typically print the medication instructions  only on the boxes and not directly on the medication tubes.   If your medication is too expensive, please contact our office at [phone] option 4 or send [place] a message through MyChart.   We are unable to tell what your co-pay for medications will be in advance as this is different depending on your insurance coverage. However, we may be able to find a substitute medication at lower cost or fill out paperwork to get insurance to cover a needed medication.   If a prior authorization is required to get your medication covered by your insurance company, please allow [place] 1-2 business days to complete this process.  Drug prices often vary depending on where the prescription is filled and some pharmacies may offer cheaper prices.  The website www.goodrx.com contains coupons for medications through different pharmacies. The prices here do not account for what the cost may be with help from insurance (it may be cheaper with your insurance), but the website can give you the price if you did not use any insurance.  - You can print the associated coupon and take it with your prescription to the pharmacy.  - You may also stop by our office during regular business hours and pick up a GoodRx coupon card.  - If you need your prescription sent electronically to a different pharmacy, notify our office through [facility] or by phone at [phone] option 4.

## 2021-04-23 NOTE — Progress Notes (Signed)
   Follow-Up Visit   Subjective  Destiny Phillips is a 20 y.o. 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 She reports that the left thigh is completely healed. She reports right thigh is still present. She does report scratching area and did not start clobetasol to affected area. ).  No hx of tanning bed use, no fhx skin cancer patient is aware of.   The following portions of the chart were reviewed this encounter and updated as appropriate:  Tobacco  Allergies  Meds  Problems  Med Hx  Surg Hx  Fam Hx      Review of Systems: No other skin or systemic complaints except as noted in HPI or Assessment and Plan.   Objective  Well appearing patient in no apparent distress; mood and affect are within normal limits.  A focused examination was performed including legs, arms. Relevant physical exam findings are noted in the Assessment and Plan.  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 Previously treated with clobetasol and intralesional kenalog without resolution.  Chronic condition with duration or expected duration over one year. Condition is bothersome to patient. Currently flared.  Start Opzelura twice daily to affected areas.  Sample given.  Lot # [id number]   Exp: Feb 2024  Start tacrolimus to affected areas following Opzelura twice daily.   Discussed Dupixent vs Adbry injections. May consider if not clearing with topical options.  Ruxolitinib Phosphate (OPZELURA) 1.5 % CREA - Right Thigh - Anterior Apply 1 application topically 2 (two) times daily.  tacrolimus (PROTOPIC) 0.1 % ointment - Right Thigh - Anterior Apply topically 2 (two) times daily.  Return for 6-8 weeks dermatitis follow up. I, Asher Muir,  CMA, am acting as scribe for Darden Dates, MD.  Documentation: I have reviewed the above documentation for accuracy and completeness, and I agree with the above.  Darden Dates, MD

## 2021-05-06 ENCOUNTER — Encounter: Payer: Self-pay | Admitting: Dermatology

## 2021-06-02 ENCOUNTER — Ambulatory Visit
Admission: EM | Admit: 2021-06-02 | Discharge: 2021-06-02 | Discharge status: Against Medical Advice | Payer: BC Managed Care – PPO

## 2021-06-02 ENCOUNTER — Ambulatory Visit (INDEPENDENT_AMBULATORY_CARE_PROVIDER_SITE_OTHER): Payer: BC Managed Care – PPO | Admitting: Family Medicine

## 2021-06-02 ENCOUNTER — Other Ambulatory Visit: Payer: Self-pay

## 2021-06-02 VITALS — BP 117/75 | HR 95 | Wt 117.4 lb

## 2021-06-02 DIAGNOSIS — J189 Pneumonia, unspecified organism: Secondary | ICD-10-CM | POA: Diagnosis not present

## 2021-06-02 DIAGNOSIS — J42 Unspecified chronic bronchitis: Secondary | ICD-10-CM | POA: Diagnosis not present

## 2021-06-02 MED ORDER — PREDNISONE 10 MG PO TABS: ORAL_TABLET | ORAL | 0 refills | Status: DC

## 2021-06-02 MED ORDER — AMOXICILLIN-POT CLAVULANATE 875-125 MG PO TABS: 1.0000 | ORAL_TABLET | Freq: Two times a day (BID) | ORAL | 0 refills | Status: DC

## 2021-06-02 MED ORDER — PROAIR DIGIHALER 108 (90 BASE) MCG/ACT IN AEPB: 1.0000 | INHALATION_SPRAY | RESPIRATORY_TRACT | 0 refills | Status: DC | PRN

## 2021-06-02 NOTE — Patient Instructions (Signed)
ProAir Inhaler 1-2 puffs every 4 hour as needed.

## 2021-06-02 NOTE — Progress Notes (Signed)
Pt reports chest congestion and head cold started in sept.Reports taking zpack in late sept early oct Felt better did not go away and now is worse.

## 2021-06-03 ENCOUNTER — Ambulatory Visit: Payer: BC Managed Care – PPO | Admitting: Family Medicine

## 2021-06-05 ENCOUNTER — Telehealth: Payer: BC Managed Care – PPO | Admitting: Family Medicine

## 2021-06-05 NOTE — Progress Notes (Signed)
Acute Office Visit  Subjective:    Patient ID: Destiny Phillips, female    DOB: 04-25-2001, 20 y.o.   MRN: [id number]  No chief complaint on file.   HPI Patient is in today for problems with worsening cough and a lot of congestion which is now changed to thick purulent congestion.  She did improved from her last visit but then it got worse again in the last couple of weeks.  She has paroxysms of cough and the cough it is deeper in her chest.  She feels as though the congestion is a lot worse and the cough is getting worse.  Her chills hemoptysis or body aches.  Past Medical History:  Diagnosis Date   Idiopathic thrombocytopenic purpura (ITP) (HCC)    SVT (supraventricular tachycardia) (HCC)     No past surgical history on file.  Family History  Problem Relation Age of Onset   Healthy Mother    Healthy Father    Cancer Paternal Uncle    Breast cancer Maternal Grandmother    Colon cancer Paternal Grandfather     Social History   Socioeconomic History   Marital status: Single    Spouse name: Not on file   Number of children: Not on file   Years of education: Not on file   Highest education level: Not on file  Occupational History   Not on file  Tobacco Use   Smoking status: Never   Smokeless tobacco: Never  Substance and Sexual Activity   Alcohol use: Never   Drug use: Never   Sexual activity: Not on file  Other Topics Concern   Not on file  Social History Narrative   Not on file   Social Determinants of Health   Financial Resource Strain: Not on file  Food Insecurity: Not on file  Transportation Needs: Not on file  Physical Activity: Not on file  Stress: Not on file  Social Connections: Not on file  Intimate Partner Violence: Not on file    Outpatient Medications Prior to Visit  Medication Sig Dispense Refill   cetirizine (ZYRTEC) 10 MG tablet Take 1 tablet (10 mg total) by mouth daily. 30 tablet 11   clobetasol ointment (TEMOVATE) 0.05 % Apply 1 application  topically 2 (two) times daily. Apply twice daily for up to 2 weeks as needed. Avoid applying to face, groin, and axilla. Use as directed. Risk of skin atrophy with long-term use reviewed. 45 g 1   Ruxolitinib Phosphate (OPZELURA) 1.5 % CREA Apply 1 application topically 2 (two) times daily. 60 g 1   tacrolimus (PROTOPIC) 0.1 % ointment Apply topically 2 (two) times daily. 60 g 1   predniSONE (DELTASONE) 10 MG tablet 12 d taper, begin with 60 mg and decrease by 10 mg every other day 42 tablet 0   No facility-administered medications prior to visit.    No Known Allergies  Review of Systems     Objective:    Physical Exam Vitals reviewed.  Constitutional:      General: She is not in acute distress.    Appearance: She is well-developed.  HENT:     Head: Normocephalic and atraumatic.     Right Ear: Hearing, tympanic membrane and external ear normal.     Left Ear: Hearing, tympanic membrane and external ear normal.     Nose: Nose normal.     Mouth/Throat:     Pharynx: Oropharynx is clear.  Eyes:     General: Lids are normal. No scleral  icterus.       Right eye: No discharge.        Left eye: No discharge.     Conjunctiva/sclera: Conjunctivae normal.  Cardiovascular:     Rate and Rhythm: Normal rate and regular rhythm.     Heart sounds: Normal heart sounds.  Pulmonary:     Effort: Pulmonary effort is normal. No respiratory distress.     Breath sounds: Wheezing present.     Comments: Diffuse expiratory wheezes without any respiratory distress. Skin:    Findings: No lesion or rash.  Neurological:     General: No focal deficit present.     Mental Status: She is alert and oriented to person, place, and time.  Psychiatric:        Mood and Affect: Mood normal.        Speech: Speech normal.        Behavior: Behavior normal.        Thought Content: Thought content normal.        Judgment: Judgment normal.    BP 117/75 (BP Location: Right Arm, Patient Position: Sitting, Cuff Size:  Small)   Pulse 95   Wt 117 lb 6.4 oz (53.3 kg)   SpO2 97%   BMI 20.80 kg/m  Wt Readings from Last 3 Encounters:  06/02/21 117 lb 6.4 oz (53.3 kg)  04/13/21 117 lb (53.1 kg)  11/26/20 121 lb (54.9 kg)    Health Maintenance Due  Topic Date Due   COVID-19 Vaccine (1) Never done   Pneumococcal Vaccine 58-70 Years old (1 - PCV) Never done   HPV VACCINES (1 - Risk 3-dose series) Never done   HIV Screening  Never done   Hepatitis C Screening  Never done   TETANUS/TDAP  Never done   PAP-Cervical Cytology Screening  Never done   PAP SMEAR-Modifier  Never done   INFLUENZA VACCINE  Never done       Topic Date Due   HPV VACCINES (1 - Risk 3-dose series) Never done     No results found for: TSH Lab Results  Component Value Date   WBC 8.6 12/11/2020   HGB 12.4 12/11/2020   HCT 39.1 12/11/2020   MCV 80.0 12/11/2020   PLT 172 12/11/2020   Lab Results  Component Value Date   NA 134 (L) 12/11/2020   K 3.8 12/11/2020   CO2 28 12/11/2020   GLUCOSE 90 12/11/2020   BUN 15 12/11/2020   CREATININE 0.75 12/11/2020   BILITOT 0.3 11/25/2012   ALKPHOS 269 11/25/2012   AST 25 11/25/2012   ALT 13 11/25/2012   PROT 7.0 11/25/2012   ALBUMIN 4.6 11/25/2012   CALCIUM 9.4 12/11/2020   ANIONGAP 7 12/11/2020   No results found for: CHOL No results found for: HDL No results found for: LDLCALC No results found for: TRIG No results found for: CHOLHDL No results found for: LFYB0F     Assessment & Plan:   Problem List Items Addressed This Visit   None Visit Diagnoses     Chronic bronchitis, unspecified chronic bronchitis type (HCC)    -  Primary   Relevant Medications   amoxicillin-clavulanate (AUGMENTIN) 875-125 MG tablet   predniSONE (DELTASONE) 10 MG tablet   Albuterol Sulfate, sensor, (PROAIR DIGIHALER) 108 (90 Base) MCG/ACT AEPB   Walking pneumonia       Relevant Medications   amoxicillin-clavulanate (AUGMENTIN) 875-125 MG tablet   predniSONE (DELTASONE) 10 MG tablet    Albuterol Sulfate, sensor, (PROAIR DIGIHALER) 108 (90  Base) MCG/ACT AEPB        Meds ordered this encounter  Medications   amoxicillin-clavulanate (AUGMENTIN) 875-125 MG tablet    Sig: Take 1 tablet by mouth 2 (two) times daily.    Dispense:  14 tablet    Refill:  0   predniSONE (DELTASONE) 10 MG tablet    Sig: Take 6 tablets Day 1, 5 Tablets Day 2, 4 Tablets Day 3, 3 Tablets Day 4, 2 Tablets Day 5, 1 Tablet Day 6    Dispense:  21 tablet    Refill:  0   Albuterol Sulfate, sensor, (PROAIR DIGIHALER) 108 (90 Base) MCG/ACT AEPB    Sig: Inhale 1-2 puffs into the lungs every 4 (four) hours as needed.    Dispense:  1 each    Refill:  0     Megan Mans, MD

## 2021-06-09 ENCOUNTER — Other Ambulatory Visit: Payer: Self-pay | Admitting: Family Medicine

## 2021-06-09 DIAGNOSIS — R053 Chronic cough: Secondary | ICD-10-CM

## 2021-06-09 MED ORDER — AZITHROMYCIN 250 MG PO TABS: ORAL_TABLET | ORAL | 0 refills | Status: AC

## 2021-06-09 NOTE — Progress Notes (Signed)
Walking pneumonia.

## 2021-06-10 ENCOUNTER — Ambulatory Visit
Admission: RE | Admit: 2021-06-10 | Discharge: 2021-06-10 | Disposition: A | Discharge status: Home / Self Care | Payer: BC Managed Care – PPO | Source: Ambulatory Visit | Attending: Family Medicine | Admitting: Family Medicine

## 2021-06-10 ENCOUNTER — Ambulatory Visit
Admission: RE | Admit: 2021-06-10 | Discharge: 2021-06-10 | Disposition: A | Discharge status: Home / Self Care | Payer: BC Managed Care – PPO | Attending: Family Medicine | Admitting: Family Medicine

## 2021-06-10 DIAGNOSIS — R053 Chronic cough: Secondary | ICD-10-CM | POA: Diagnosis not present

## 2021-06-10 DIAGNOSIS — R059 Cough, unspecified: Secondary | ICD-10-CM | POA: Diagnosis not present

## 2021-06-14 ENCOUNTER — Other Ambulatory Visit: Payer: Self-pay | Admitting: Family Medicine

## 2021-06-14 DIAGNOSIS — J189 Pneumonia, unspecified organism: Secondary | ICD-10-CM

## 2021-06-14 MED ORDER — DOXYCYCLINE HYCLATE 100 MG PO TABS: 100.0000 mg | ORAL_TABLET | Freq: Two times a day (BID) | ORAL | 0 refills | Status: DC

## 2021-06-14 MED ORDER — FLUTICASONE PROPIONATE 50 MCG/ACT NA SUSP: 2.0000 | Freq: Every day | NASAL | 6 refills | Status: DC

## 2021-06-14 NOTE — Progress Notes (Signed)
Doxy for walking pneumonia.

## 2021-06-15 ENCOUNTER — Other Ambulatory Visit: Payer: Self-pay | Admitting: Family Medicine

## 2021-06-15 MED ORDER — BUDESONIDE-FORMOTEROL FUMARATE 160-4.5 MCG/ACT IN AERO: 2.0000 | INHALATION_SPRAY | Freq: Every day | RESPIRATORY_TRACT | 12 refills | Status: DC

## 2021-06-25 ENCOUNTER — Other Ambulatory Visit: Payer: Self-pay

## 2021-06-25 ENCOUNTER — Ambulatory Visit (INDEPENDENT_AMBULATORY_CARE_PROVIDER_SITE_OTHER): Payer: BC Managed Care – PPO | Admitting: Dermatology

## 2021-06-25 DIAGNOSIS — L2081 Atopic neurodermatitis: Secondary | ICD-10-CM | POA: Diagnosis not present

## 2021-06-25 DIAGNOSIS — L309 Dermatitis, unspecified: Secondary | ICD-10-CM

## 2021-06-25 MED ORDER — OPZELURA 1.5 % EX CREA: 1.0000 "application " | TOPICAL_CREAM | Freq: Every day | CUTANEOUS | 2 refills | Status: DC

## 2021-06-25 NOTE — Patient Instructions (Addendum)
Continue Opzelura once daily.   Dupilumab (Dupixent) is a treatment given by injection for adults and children with moderate-to-severe atopic dermatitis. Goal is control of skin condition, not cure. It is given as 2 injections at the first dose followed by 1 injection ever 2 weeks thereafter.  Young children are dosed monthly.  Potential side effects include allergic reaction, herpes infections, injection site reactions and conjunctivitis (inflammation of the eyes).  The use of Dupixent requires long term medication management, including periodic office visits.  If You Need Anything After Your Visit  If you have any questions or concerns for your doctor, please call our main line at [phone] and press option 4 to reach your doctor's medical assistant. If no one answers, please leave a voicemail as directed and we will return your call as soon as possible. Messages left after 4 pm will be answered the following business day.   You may also send [place] a message via MyChart. We typically respond to MyChart messages within 1-2 business days.  For prescription refills, please ask your pharmacy to contact our office. Our fax number is [phone].  If you have an urgent issue when the clinic is closed that cannot wait until the next business day, you can page your doctor at the number below.    Please note that while we do our best to be available for urgent issues outside of office hours, we are not available 24/7.   If you have an urgent issue and are unable to reach [place], you may choose to seek medical care at your doctor's office, retail clinic, urgent care center, or emergency room.  If you have a medical emergency, please immediately call 911 or go to the emergency department.  Pager Numbers  - Dr. Gwen Pounds: [phone]  - Dr. Neale Burly: [phone]  - Dr. Roseanne Reno: [phone]  In the event of inclement weather, please call our main line at [phone] for an update on the status of any  delays or closures.  Dermatology Medication Tips: Please keep the boxes that topical medications come in in order to help keep track of the instructions about where and how to use these. Pharmacies typically print the medication instructions only on the boxes and not directly on the medication tubes.   If your medication is too expensive, please contact our office at [phone] option 4 or send [place] a message through MyChart.   We are unable to tell what your co-pay for medications will be in advance as this is different depending on your insurance coverage. However, we may be able to find a substitute medication at lower cost or fill out paperwork to get insurance to cover a needed medication.   If a prior authorization is required to get your medication covered by your insurance company, please allow [place] 1-2 business days to complete this process.  Drug prices often vary depending on where the prescription is filled and some pharmacies may offer cheaper prices.  The website www.goodrx.com contains coupons for medications through different pharmacies. The prices here do not account for what the cost may be with help from insurance (it may be cheaper with your insurance), but the website can give you the price if you did not use any insurance.  - You can print the associated coupon and take it with your prescription to the pharmacy.  - You may also stop by our office during regular business hours and pick up a GoodRx coupon card.  - If you need your prescription sent  electronically to a different pharmacy, notify our office through [facility] or by phone at [phone] option 4.     Si Usted Necesita Algo Despus de Su Visita  Tambin puede enviarnos un mensaje a travs de Clinical cytogeneticist. Por lo general respondemos a los mensajes de MyChart en el transcurso de 1 a 2 das hbiles.  Para renovar recetas, por favor pida a su farmacia que se ponga en contacto con nuestra oficina. Annie Sable de fax es [place] [phone].  Si tiene un asunto urgente cuando la clnica est cerrada y que no puede esperar hasta el siguiente da hbil, puede llamar/localizar a su doctor(a) al nmero que aparece a continuacin.   Por favor, tenga en cuenta que aunque hacemos todo lo posible para estar disponibles para asuntos urgentes fuera del horario de [place], no estamos disponibles las 24 horas del da, los 7 [address]  [address] [place].   Si tiene un problema urgente y no puede comunicarse con nosotros, puede optar por buscar atencin mdica  en el consultorio de su doctor(a), en una clnica privada, en un centro de atencin urgente o en una sala de emergencias.  Si tiene Engineer, drilling, por favor llame inmediatamente al 911 o vaya a la sala de emergencias.  Nmeros de bper  - Dr. Gwen Pounds: [phone]  - Dra. Moye: [phone]  - Dra. Roseanne Reno: [phone]  En caso de inclemencias del [place], por favor llame a Lacy Duverney principal al [phone] para una actualizacin sobre el [place] de cualquier retraso o cierre.  Consejos para la medicacin en dermatologa: Por favor, guarde las cajas en las que vienen los medicamentos de uso tpico para ayudarle a seguir las instrucciones sobre dnde y cmo usarlos. Las farmacias generalmente imprimen las instrucciones del medicamento slo en las cajas y no directamente en los tubos del [place].   Si su medicamento es muy caro, por favor, pngase en contacto con Rolm Gala llamando al [phone] y presione la opcin 4 o envenos un mensaje a travs de Clinical cytogeneticist.   No podemos decirle cul ser su copago por los medicamentos por adelantado ya que esto es diferente dependiendo de la cobertura de su seguro. Sin embargo, es posible que podamos encontrar un medicamento sustituto a Audiological scientist un formulario para que el seguro cubra el medicamento que se considera necesario.   Si se requiere una autorizacin previa para que su compaa de  seguros [place] su medicamento, por favor permtanos de 1 a 2 das hbiles para completar [address]  Los precios de los medicamentos varan con frecuencia dependiendo del Environmental consultant de dnde se surte la receta y alguna farmacias pueden ofrecer precios ms baratos.  El sitio web www.goodrx.com tiene cupones para medicamentos de Health and safety inspector. Los precios aqu no tienen en cuenta lo que podra costar con la ayuda del seguro (puede ser ms barato con su seguro), pero el sitio web puede darle el precio si no utiliz Tourist information centre manager.  - Puede imprimir el cupn correspondiente y llevarlo con su receta a la farmacia.  - Tambin puede pasar por nuestra oficina durante el horario de atencin regular y Education officer, museum una tarjeta de cupones de GoodRx.  - Si necesita que su receta se enve electrnicamente a una farmacia diferente, informe a nuestra oficina a travs de MyChart de [place] o por telfono llamando al [phone] y presione la opcin 4.

## 2021-06-25 NOTE — Progress Notes (Signed)
   Follow-Up Visit   Subjective  Destiny Phillips is a 20 y.o. female who presents for the following: Follow-up (Patient here today for dermatitis follow up. Patient did not get tacrolimus, she thought she had to drive to [facility] to pick it up. Patient did use samples of Opzelura a few times but advises she is not good about compliance and applying topical creams. Patient does think legs have improved since last visit. ).  Patient recently on doxycycline and prednisone for bronchitis and is not having any itching right now.   The following portions of the chart were reviewed this encounter and updated as appropriate:   Tobacco  Allergies  Meds  Problems  Med Hx  Surg Hx  Fam Hx      Review of Systems:  No other skin or systemic complaints except as noted in HPI or Assessment and Plan.  Objective  Well appearing patient in no apparent distress; mood and affect are within normal limits.  A focused examination was performed including bilateral legs. Relevant physical exam findings are noted in the Assessment and Plan.  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 Condition is bothersome to patient. Currently flared.  Recommend patient be more consistent and use Opzelura once daily. Avoid rubbing as much as possible. If not significantly improving in 1 month, will submit for Dupixent.   Dupilumab (Dupixent) is a treatment given by injection for adults and children with moderate-to-severe atopic dermatitis. Goal is control of skin condition, not cure. It is given as 2 injections at the first dose followed by 1 injection ever 2 weeks thereafter.  Young children are dosed monthly.  Potential side effects include allergic reaction, herpes infections, injection site  reactions and conjunctivitis (inflammation of the eyes).  The use of Dupixent requires long term medication management, including periodic office visits.  No hx of cold sores.   Ruxolitinib Phosphate (OPZELURA) 1.5 % CREA - bilateral legs Apply 1 application topically daily.   Return in about 2 months (around 08/26/2021) for Dermatitis.  Anise Salvo, RMA, am acting as scribe for Darden Dates, MD .    Documentation: I have reviewed the above documentation for accuracy and completeness, and I agree with the above.  Darden Dates, MD

## 2021-06-27 NOTE — Progress Notes (Signed)
Synopsis: Referred for walking pneumonia by Jerrol Banana.,*  Subjective:   PATIENT ID: Destiny Phillips GENDER: female DOB: 2000-12-06, MRN: [id number]  Chief Complaint  Patient presents with   Consult    Pt states she had a very bad cough for months and now have chest pain    20yF with history of SVT, ITP who is referred for walking pneumonia, chronic cough.  She says that she's had a cough for about a month. Got prednisone and z-pack and then doxy. Cough has improved and chest congestion has improved as well.   She has some pleuritic left sided CP over the last couple of weeks. Not coughing up any blood. She has no dyspnea. Started symbicort a couple of weeks ago. PCP thinks she may have developed asthma. Rinses mouth after using symbicort. She has never had a blood clot. Not taking any birth control pills.   She was tested for flu and covid, negative. She wasn't vaccinated for covid.  Never had asthma as a kid that she was aware of but did have inhaler.   Last time she needed steroids for ITP was years ago.   Otherwise pertinent review of systems is negative.  No family history of lung disease.  She goes to school online. She does jewelry sales/. She has never lived outside of [place], no recent travel. No pets at home  Past Medical History:  Diagnosis Date   Idiopathic thrombocytopenic purpura (ITP) (HCC)    SVT (supraventricular tachycardia) (HCC)      Family History  Problem Relation Age of Onset   Healthy Mother    Healthy Father    Cancer Paternal Uncle    Breast cancer Maternal Grandmother    Colon cancer Paternal Grandfather      No past surgical history on file.  Social History   Socioeconomic History   Marital status: Single    Spouse name: Not on file   Number of children: Not on file   Years of education: Not on file   Highest education level: Not on file  Occupational History   Not on file  Tobacco Use   Smoking status: Never   Smokeless  tobacco: Never  Substance and Sexual Activity   Alcohol use: Never   Drug use: Never   Sexual activity: Not on file  Other Topics Concern   Not on file  Social History Narrative   Not on file   Social Determinants of Health   Financial Resource Strain: Not on file  Food Insecurity: Not on file  Transportation Needs: Not on file  Physical Activity: Not on file  Stress: Not on file  Social Connections: Not on file  Intimate Partner Violence: Not on file     No Known Allergies   Outpatient Medications Prior to Visit  Medication Sig Dispense Refill   Albuterol Sulfate, sensor, (PROAIR DIGIHALER) 108 (90 Base) MCG/ACT AEPB Inhale 1-2 puffs into the lungs every 4 (four) hours as needed. 1 each 0   budesonide-formoterol (SYMBICORT) 160-4.5 MCG/ACT inhaler Inhale 2 puffs into the lungs daily. 1 each 12   cetirizine (ZYRTEC) 10 MG tablet Take 1 tablet (10 mg total) by mouth daily. 30 tablet 11   Ruxolitinib Phosphate (OPZELURA) 1.5 % CREA Apply 1 application topically 2 (two) times daily. 60 g 1   Ruxolitinib Phosphate (OPZELURA) 1.5 % CREA Apply 1 application topically daily. 60 g 2   amoxicillin-clavulanate (AUGMENTIN) 875-125 MG tablet Take 1 tablet by mouth 2 (two) times  daily. (Patient not taking: Reported on 06/29/2021) 14 tablet 0   clobetasol ointment (TEMOVATE) AB-123456789 % Apply 1 application topically 2 (two) times daily. Apply twice daily for up to 2 weeks as needed. Avoid applying to face, groin, and axilla. Use as directed. Risk of skin atrophy with long-term use reviewed. (Patient not taking: Reported on 06/29/2021) 45 g 1   doxycycline (VIBRA-TABS) 100 MG tablet Take 1 tablet (100 mg total) by mouth 2 (two) times daily. (Patient not taking: Reported on 06/29/2021) 10 tablet 0   fluticasone (FLONASE) 50 MCG/ACT nasal spray Place 2 sprays into both nostrils daily. (Patient not taking: Reported on 06/29/2021) 16 g 6   predniSONE (DELTASONE) 10 MG tablet Take 6 tablets Day 1, 5 Tablets  Day 2, 4 Tablets Day 3, 3 Tablets Day 4, 2 Tablets Day 5, 1 Tablet Day 6 (Patient not taking: Reported on 06/29/2021) 21 tablet 0   tacrolimus (PROTOPIC) 0.1 % ointment Apply topically 2 (two) times daily. (Patient not taking: Reported on 06/29/2021) 60 g 1   No facility-administered medications prior to visit.       Objective:   Physical Exam:  General appearance: 20 y.o., female, NAD, conversant  Eyes: anicteric sclerae; PERRL, tracking appropriately HENT: NCAT; MMM Neck: Trachea midline; no lymphadenopathy, no JVD Lungs: CTAB, no crackles, no wheeze, with normal respiratory effort CV: RRR, no murmur  Abdomen: Soft, non-tender; non-distended, BS present  Extremities: No peripheral edema, warm Skin: Normal turgor and texture; no rash Psych: Appropriate affect Neuro: Alert and oriented to person and place, no focal deficit     Vitals:   06/29/21 0935  BP: 110/70  Pulse: 86  Temp: 98.2 F (36.8 C)  TempSrc: Oral  SpO2: 100%  Weight: 117 lb (53.1 kg)  Height: 5\' 3"  (1.6 m)   100% on RA BMI Readings from Last 3 Encounters:  06/29/21 20.73 kg/m  06/02/21 20.80 kg/m  04/13/21 20.73 kg/m   Wt Readings from Last 3 Encounters:  06/29/21 117 lb (53.1 kg)  06/02/21 117 lb 6.4 oz (53.3 kg)  04/13/21 117 lb (53.1 kg)     CBC    Component Value Date/Time   WBC 8.6 12/11/2020 1904   RBC 4.89 12/11/2020 1904   HGB 12.4 12/11/2020 1904   HGB 11.7 11/26/2020 1407   HCT 39.1 12/11/2020 1904   HCT 36.5 11/26/2020 1407   PLT 172 12/11/2020 1904   PLT 137 (L) 11/26/2020 1407   MCV 80.0 12/11/2020 1904   MCV 78 (L) 11/26/2020 1407   MCH 25.4 (L) 12/11/2020 1904   MCHC 31.7 12/11/2020 1904   RDW 14.4 12/11/2020 1904   RDW 14.4 11/26/2020 1407   LYMPHSABS 3.1 12/11/2020 1904   LYMPHSABS 1.5 11/26/2020 1407   MONOABS 0.7 12/11/2020 1904   EOSABS 0.1 12/11/2020 1904   EOSABS 0.1 11/26/2020 1407   BASOSABS 0.0 12/11/2020 1904   BASOSABS 0.0 11/26/2020 1407    Eos  unremarkable  Chest Imaging:  CXR 06/10/21 reviewed by me unremarkable  CXR 12/11/20 reviewed by me unremarkable   Pulmonary Functions Testing Results: No flowsheet data found.     Assessment & Plan:   # Pleuritic chest pain: MSK strain vs costochondritis vs rib contusion or fracture from coughing. She PERCs out, no need for PE evaluation with d-dimer or CTA.   # Recent lower respiratory tract infection: # Subacute cough: Too early to tell if her improvement in cough is due to resolving LRTI or if it's because symbicort and  steroid burst have improved asthma control (if she has underlying asthma).   # History of covid-19 infection  Plan: - although nondisplaced rib fracture if present would have self-limited course, she values a high degree of certainty in diagnosis and would appreciate the info prognostically to guide her return to exercise. We will order CT chest wo contrast.  - lidoderm patch for CP - PFTs and clinic visit in a month, needs to be off symbicort for a couple of weeks beforehand, albuterol for a couple of days beforehand     Omar Person, MD [facility] 06/29/2021 9:41 AM

## 2021-06-29 ENCOUNTER — Encounter: Payer: Self-pay | Admitting: Student

## 2021-06-29 ENCOUNTER — Other Ambulatory Visit: Payer: Self-pay

## 2021-06-29 ENCOUNTER — Ambulatory Visit (INDEPENDENT_AMBULATORY_CARE_PROVIDER_SITE_OTHER): Payer: BC Managed Care – PPO | Admitting: Student

## 2021-06-29 VITALS — BP 110/70 | HR 86 | Temp 98.2°F | Ht 63.0 in | Wt 117.0 lb

## 2021-06-29 DIAGNOSIS — R0789 Other chest pain: Secondary | ICD-10-CM | POA: Diagnosis not present

## 2021-06-29 DIAGNOSIS — R0781 Pleurodynia: Secondary | ICD-10-CM | POA: Diagnosis not present

## 2021-06-29 MED ORDER — LIDOCAINE 5 % EX PTCH: 1.0000 | MEDICATED_PATCH | CUTANEOUS | 1 refills | Status: DC

## 2021-06-29 NOTE — Patient Instructions (Signed)
-   you will be called to schedule PFTs in a month on same day as clinic appointment - make appointment with me at front desk for roughly a month from now - hold off on symbicort for at least 2 weeks before PFTs  - hold off on albuterol for at least 2 days before PFTs - you will be called to schedule CT Chest

## 2021-06-30 ENCOUNTER — Other Ambulatory Visit (HOSPITAL_COMMUNITY): Payer: Self-pay

## 2021-07-07 ENCOUNTER — Encounter: Payer: Self-pay | Admitting: Dermatology

## 2021-07-10 ENCOUNTER — Other Ambulatory Visit: Payer: BC Managed Care – PPO

## 2021-07-14 ENCOUNTER — Ambulatory Visit (INDEPENDENT_AMBULATORY_CARE_PROVIDER_SITE_OTHER)
Admission: RE | Admit: 2021-07-14 | Discharge: 2021-07-14 | Disposition: A | Discharge status: Home / Self Care | Payer: BC Managed Care – PPO | Source: Ambulatory Visit | Attending: Student | Admitting: Student

## 2021-07-14 ENCOUNTER — Other Ambulatory Visit: Payer: Self-pay

## 2021-07-14 DIAGNOSIS — R0781 Pleurodynia: Secondary | ICD-10-CM | POA: Diagnosis not present

## 2021-07-14 DIAGNOSIS — R0789 Other chest pain: Secondary | ICD-10-CM

## 2021-07-14 DIAGNOSIS — R911 Solitary pulmonary nodule: Secondary | ICD-10-CM | POA: Diagnosis not present

## 2021-07-15 ENCOUNTER — Telehealth: Payer: Self-pay | Admitting: Student

## 2021-07-15 NOTE — Telephone Encounter (Signed)
Called and left voicemail with results. Can discuss further at next visit 1/31 or by phone/mychart if she has any questions about results. If she's still feeling sore by time of PFTs 1/31 then she should skip them.

## 2021-07-30 ENCOUNTER — Institutional Professional Consult (permissible substitution): Payer: BC Managed Care – PPO | Admitting: Pulmonary Disease

## 2021-08-17 DIAGNOSIS — Z03818 Encounter for observation for suspected exposure to other biological agents ruled out: Secondary | ICD-10-CM | POA: Diagnosis not present

## 2021-08-17 DIAGNOSIS — Z20822 Contact with and (suspected) exposure to covid-19: Secondary | ICD-10-CM | POA: Diagnosis not present

## 2021-08-17 NOTE — Progress Notes (Signed)
Synopsis: Referred for walking pneumonia by Jerrol Banana.,*  Subjective:   PATIENT ID: Destiny Phillips GENDER: female DOB: [birth year], MRN: [id number]  Chief Complaint  Patient presents with   Follow-up    PFT done today. Breathing is doing well and pain she was having has resolved.    20yF with history of SVT, ITP who is referred for walking pneumonia, chronic cough.  She says that she's had a cough for about a month. Got prednisone and z-pack and then doxy. Cough has improved and chest congestion has improved as well.   She has some pleuritic left sided CP over the last couple of weeks. Not coughing up any blood. She has no dyspnea. Started symbicort a couple of weeks ago. PCP thinks she may have developed asthma. Rinses mouth after using symbicort. She has never had a blood clot. Not taking any birth control pills.   She was tested for flu and covid, negative. She wasn't vaccinated for covid.  Never had asthma as a kid that she was aware of but did have inhaler.   Last time she needed steroids for ITP was years ago.   Otherwise pertinent review of systems is negative.  No family history of lung disease.  She goes to school online. She does jewelry sales/. She has never lived outside of [place], no recent travel. No pets at home  Interval HPI: Last seen by me 06/29/22 found to have nondisplaced rib fracture.   She has no cough. No fever, night sweats drenching sheets, unintentional weight loss. No DOE currently.   Started running again and taking some time to get back into it. No pain when she does it.   Grandmother with RA.   Past Medical History:  Diagnosis Date   Idiopathic thrombocytopenic purpura (ITP) (HCC)    SVT (supraventricular tachycardia) (HCC)      Family History  Problem Relation Age of Onset   Healthy Mother    Healthy Father    Cancer Paternal Uncle    Breast cancer Maternal Grandmother    Colon cancer Paternal Grandfather      No past surgical  history on file.  Social History   Socioeconomic History   Marital status: Single    Spouse name: Not on file   Number of children: Not on file   Years of education: Not on file   Highest education level: Not on file  Occupational History   Not on file  Tobacco Use   Smoking status: Never   Smokeless tobacco: Never  Substance and Sexual Activity   Alcohol use: Never   Drug use: Never   Sexual activity: Not on file  Other Topics Concern   Not on file  Social History Narrative   Not on file   Social Determinants of Health   Financial Resource Strain: Not on file  Food Insecurity: Not on file  Transportation Needs: Not on file  Physical Activity: Not on file  Stress: Not on file  Social Connections: Not on file  Intimate Partner Violence: Not on file     No Known Allergies   Outpatient Medications Prior to Visit  Medication Sig Dispense Refill   Ruxolitinib Phosphate (OPZELURA) 1.5 % CREA Apply 1 application topically 2 (two) times daily. 60 g 1   Albuterol Sulfate, sensor, (PROAIR DIGIHALER) 108 (90 Base) MCG/ACT AEPB Inhale 1-2 puffs into the lungs every 4 (four) hours as needed. (Patient not taking: Reported on 08/18/2021) 1 each 0   budesonide-formoterol (SYMBICORT)  160-4.5 MCG/ACT inhaler Inhale 2 puffs into the lungs daily. (Patient not taking: Reported on 08/18/2021) 1 each 12   amoxicillin-clavulanate (AUGMENTIN) 875-125 MG tablet Take 1 tablet by mouth 2 (two) times daily. (Patient not taking: Reported on 06/29/2021) 14 tablet 0   cetirizine (ZYRTEC) 10 MG tablet Take 1 tablet (10 mg total) by mouth daily. 30 tablet 11   clobetasol ointment (TEMOVATE) AB-123456789 % Apply 1 application topically 2 (two) times daily. Apply twice daily for up to 2 weeks as needed. Avoid applying to face, groin, and axilla. Use as directed. Risk of skin atrophy with long-term use reviewed. (Patient not taking: Reported on 06/29/2021) 45 g 1   doxycycline (VIBRA-TABS) 100 MG tablet Take 1 tablet  (100 mg total) by mouth 2 (two) times daily. (Patient not taking: Reported on 06/29/2021) 10 tablet 0   fluticasone (FLONASE) 50 MCG/ACT nasal spray Place 2 sprays into both nostrils daily. (Patient not taking: Reported on 06/29/2021) 16 g 6   lidocaine (LIDODERM) 5 % Place 1 patch onto the skin daily. Remove & Discard patch within 12 hours or as directed by MD 30 patch 1   predniSONE (DELTASONE) 10 MG tablet Take 6 tablets Day 1, 5 Tablets Day 2, 4 Tablets Day 3, 3 Tablets Day 4, 2 Tablets Day 5, 1 Tablet Day 6 (Patient not taking: Reported on 06/29/2021) 21 tablet 0   Ruxolitinib Phosphate (OPZELURA) 1.5 % CREA Apply 1 application topically daily. 60 g 2   tacrolimus (PROTOPIC) 0.1 % ointment Apply topically 2 (two) times daily. (Patient not taking: Reported on 06/29/2021) 60 g 1   No facility-administered medications prior to visit.       Objective:   Physical Exam:  General appearance: 21 y.o., female, NAD, conversant  Eyes: anicteric sclerae; PERRL, tracking appropriately HENT: NCAT; MMM Neck: Trachea midline; no lymphadenopathy, no JVD Lungs: CTAB, no crackles, no wheeze, with normal respiratory effort CV: RRR, no murmur  Abdomen: Soft, non-tender; non-distended, BS present  Extremities: No peripheral edema, warm Skin: Normal turgor and texture; no rash Psych: Appropriate affect Neuro: Alert and oriented to person and place, no focal deficit     Vitals:   08/18/21 1155  BP: 118/68  Pulse: 91  Temp: 97.9 F (36.6 C)  TempSrc: Oral  SpO2: 100%  Weight: 118 lb (53.5 kg)  Height: 5\' 3"  (1.6 m)    100% on RA BMI Readings from Last 3 Encounters:  08/18/21 20.90 kg/m  06/29/21 20.73 kg/m  06/02/21 20.80 kg/m   Wt Readings from Last 3 Encounters:  08/18/21 118 lb (53.5 kg)  06/29/21 117 lb (53.1 kg)  06/02/21 117 lb 6.4 oz (53.3 kg)     CBC    Component Value Date/Time   WBC 8.6 12/11/2020 1904   RBC 4.89 12/11/2020 1904   HGB 12.4 12/11/2020 1904   HGB  11.7 11/26/2020 1407   HCT 39.1 12/11/2020 1904   HCT 36.5 11/26/2020 1407   PLT 172 12/11/2020 1904   PLT 137 (L) 11/26/2020 1407   MCV 80.0 12/11/2020 1904   MCV 78 (L) 11/26/2020 1407   MCH 25.4 (L) 12/11/2020 1904   MCHC 31.7 12/11/2020 1904   RDW 14.4 12/11/2020 1904   RDW 14.4 11/26/2020 1407   LYMPHSABS 3.1 12/11/2020 1904   LYMPHSABS 1.5 11/26/2020 1407   MONOABS 0.7 12/11/2020 1904   EOSABS 0.1 12/11/2020 1904   EOSABS 0.1 11/26/2020 1407   BASOSABS 0.0 12/11/2020 1904   BASOSABS 0.0 11/26/2020 1407  Eos unremarkable  Chest Imaging:  CXR 06/10/21 reviewed by me unremarkable  CXR 12/11/20 reviewed by me unremarkable  CT Chest 12/27 reviewed by me remarkable for several nodular opacities largest 23mm some with surrounding ggo   Pulmonary Functions Testing Results: PFT Results Latest Ref Rng & Units 08/18/2021  FVC-Pre L 3.20  FVC-Predicted Pre % 87  FVC-Post L 3.34  FVC-Predicted Post % 91  Pre FEV1/FVC % % 83  Post FEV1/FCV % % 86  FEV1-Pre L 2.65  FEV1-Predicted Pre % 82  FEV1-Post L 2.86  DLCO uncorrected ml/min/mmHg 19.54  DLCO UNC% % 91  DLCO corrected ml/min/mmHg 19.54  DLCO COR %Predicted % 91  DLVA Predicted % 96  TLC L 4.34  TLC % Predicted % 88  RV % Predicted % 112   Reviewed by me with borderline elevated RV/TLC suggestive of air trapping but normal FVC/SVC     Assessment & Plan:   # Pleuritic chest pain: Due to rib fracture. Largely resolved.  # Pulmonary nodules # Subacute cough Likely due to atypical pneumonia, cough resolved. Dominant 1.4cm. Never smoker.  # History of covid-19 infection  Plan: - CT Chest in March, clinic appointment afterward to review     Maryjane Hurter, MD [facility] 08/18/2021 12:13 PM

## 2021-08-18 ENCOUNTER — Encounter: Payer: Self-pay | Admitting: Student

## 2021-08-18 ENCOUNTER — Other Ambulatory Visit: Payer: Self-pay

## 2021-08-18 ENCOUNTER — Ambulatory Visit (INDEPENDENT_AMBULATORY_CARE_PROVIDER_SITE_OTHER): Payer: BC Managed Care – PPO | Admitting: Student

## 2021-08-18 ENCOUNTER — Telehealth: Payer: Self-pay | Admitting: Student

## 2021-08-18 VITALS — BP 118/68 | HR 91 | Temp 97.9°F | Ht 63.0 in | Wt 118.0 lb

## 2021-08-18 DIAGNOSIS — R0781 Pleurodynia: Secondary | ICD-10-CM

## 2021-08-18 DIAGNOSIS — R918 Other nonspecific abnormal finding of lung field: Secondary | ICD-10-CM | POA: Diagnosis not present

## 2021-08-18 LAB — PULMONARY FUNCTION TEST
DL/VA % pred: 96 %
DL/VA: 4.63 ml/min/mmHg/L
DLCO cor % pred: 91 %
DLCO cor: 19.54 ml/min/mmHg
DLCO unc % pred: 91 %
DLCO unc: 19.54 ml/min/mmHg
FEF 25-75 Post: 3.56 L/sec
FEF 25-75 Pre: 2.54 L/sec
FEF2575-%Change-Post: 40 %
FEF2575-%Pred-Post: 97 %
FEF2575-%Pred-Pre: 69 %
FEV1-%Change-Post: 8 %
FEV1-%Pred-Post: 88 %
FEV1-%Pred-Pre: 82 %
FEV1-Post: 2.86 L
FEV1-Pre: 2.65 L
FEV1FVC-%Change-Post: 3 %
FEV1FVC-%Pred-Pre: 95 %
FEV6-%Change-Post: 4 %
FEV6-%Pred-Post: 90 %
FEV6-%Pred-Pre: 87 %
FEV6-Post: 3.34 L
FEV6-Pre: 3.2 L
FEV6FVC-%Pred-Post: 99 %
FEV6FVC-%Pred-Pre: 99 %
FVC-%Change-Post: 4 %
FVC-%Pred-Post: 91 %
FVC-%Pred-Pre: 87 %
FVC-Post: 3.34 L
FVC-Pre: 3.2 L
Post FEV1/FVC ratio: 86 %
Post FEV6/FVC ratio: 100 %
Pre FEV1/FVC ratio: 83 %
Pre FEV6/FVC Ratio: 100 %
RV % pred: 112 %
RV: 1.3 L
TLC % pred: 88 %
TLC: 4.34 L

## 2021-08-18 NOTE — Patient Instructions (Signed)
-   CT Chest in a couple months and clinic follow up with me afterward

## 2021-08-18 NOTE — Patient Instructions (Signed)
Full PFT performed today. °

## 2021-08-18 NOTE — Telephone Encounter (Signed)
This is an FYI to the provider.  °

## 2021-08-18 NOTE — Progress Notes (Signed)
Full PFT performed today. °

## 2021-08-19 ENCOUNTER — Telehealth: Payer: Self-pay | Admitting: Student

## 2021-08-19 NOTE — Telephone Encounter (Signed)
Please see duplicate phone note dated as 08/19/2021

## 2021-08-19 NOTE — Telephone Encounter (Signed)
Please advise if NM is ok with scheduling CT sooner than ordered.

## 2021-08-19 NOTE — Telephone Encounter (Signed)
Dr. Thora Lance, patient would like CT sooner then March.  Please advise. thanks

## 2021-08-21 NOTE — Telephone Encounter (Signed)
Should ideally wait 3 months from last one which would be ~10/12/21. Sooner and it's probably not an adequate time frame to monitor for any concerning growth in lung nodules.

## 2021-08-24 NOTE — Telephone Encounter (Signed)
Tried calling pt and there was no answer- LMTCB.  

## 2021-08-26 ENCOUNTER — Ambulatory Visit: Payer: BC Managed Care – PPO | Admitting: Dermatology

## 2021-10-12 ENCOUNTER — Other Ambulatory Visit: Payer: Self-pay

## 2021-10-12 ENCOUNTER — Ambulatory Visit (INDEPENDENT_AMBULATORY_CARE_PROVIDER_SITE_OTHER)
Admission: RE | Admit: 2021-10-12 | Discharge: 2021-10-12 | Disposition: A | Discharge status: Home / Self Care | Payer: Managed Care, Other (non HMO) | Source: Ambulatory Visit | Attending: Student | Admitting: Student

## 2021-10-12 DIAGNOSIS — R918 Other nonspecific abnormal finding of lung field: Secondary | ICD-10-CM

## 2021-10-13 ENCOUNTER — Encounter: Payer: Self-pay | Admitting: Student

## 2021-10-13 DIAGNOSIS — R918 Other nonspecific abnormal finding of lung field: Secondary | ICD-10-CM

## 2021-10-13 NOTE — Telephone Encounter (Signed)
Dr. Thora Lance, please advise on pt's email. She is requesting the results of her CT. Pt has follow up with you on 11/11/21. Thanks.  ?

## 2021-10-14 NOTE — Telephone Encounter (Signed)
Can we schedule next available appointment with me, I'm ok with overbooking? ? ?Sent following mychart message: ? ?Hard to know what to make of it. Overall I think I definitely agree with the radiologist that infectious/inflammatory stuff comes to mind first. There are some pretty non-toxic infections (fungal infections, nontuberculous mycobacteria, among others) and autoimmune conditions (RA, sarcoid, ANCA vasculitis, among others) that can lead to waxing and waning changes like this. I think in order to decide what to do about it, it would probably be best to see you in clinic to discuss. I'd probably recommend getting blood tests to look for those autoimmune conditions (depending on whether there are any extrapulmonary symptoms suggestive of anything in particular) and we could talk about whether or not you'd benefit from bronchoscopy to see if there's evidence of infection and even consider trying to get a sample of the left lower lobe nodule to be on the safe side. I'll send a message to our staff to schedule the next available appointment with me that works for you. Let me know if you have any questions! ?

## 2021-10-15 NOTE — Telephone Encounter (Signed)
Dr. Thora Lance,  ?Please advise on pt's email. She would like to have any blood work done prior to her OV so she is able to to discuss the results with you at the OV. Thanks.  ?

## 2021-10-15 NOTE — Telephone Encounter (Signed)
Called patient but she did not answer. Left message for her to call back.  

## 2021-10-22 ENCOUNTER — Other Ambulatory Visit: Payer: Managed Care, Other (non HMO)

## 2021-10-22 DIAGNOSIS — R918 Other nonspecific abnormal finding of lung field: Secondary | ICD-10-CM

## 2021-10-24 LAB — ANA+ENA+DNA/DS+SCL 70+SJOSSA/B
ANA Titer 1: NEGATIVE
ENA RNP Ab: <0.2 AI (ref 0.0–0.9)
ENA SM Ab Ser-aCnc: <0.2 AI (ref 0.0–0.9)
ENA SSA (RO) Ab: <0.2 AI (ref 0.0–0.9)
ENA SSB (LA) Ab: <0.2 AI (ref 0.0–0.9)
Scleroderma (Scl-70) (ENA) Antibody, IgG: <0.2 AI (ref 0.0–0.9)
dsDNA Ab: <1 IU/mL (ref 0–9)

## 2021-10-24 LAB — CYCLIC CITRUL PEPTIDE ANTIBODY, IGG: Cyclic Citrullin Peptide Ab: <16 UNITS

## 2021-10-24 LAB — ANCA SCREEN W REFLEX TITER: ANCA SCREEN: NEGATIVE

## 2021-10-24 LAB — RHEUMATOID FACTOR: Rheumatoid fact SerPl-aCnc: <14 IU/mL (ref <14)

## 2021-10-25 NOTE — H&P (View-Only) (Signed)
? ?Synopsis: Referred for walking pneumonia by Jerrol Banana.,* ? ?Subjective:  ? ?PATIENT ID: Destiny Phillips GENDER: female DOB: 2001/03/19, MRN: [id number] ? ?Chief Complaint  ?Patient presents with  ? Follow-up  ?  Breathing is doing well and she denies any new co's.   ? ?20yF with history of SVT, ITP who is referred for walking pneumonia, chronic cough. ? ?She says that she's had a cough for about a month. Got prednisone and z-pack and then doxy. Cough has improved and chest congestion has improved as well.  ? ?She has some pleuritic left sided CP over the last couple of weeks. Not coughing up any blood. She has no dyspnea. Started symbicort a couple of weeks ago. PCP thinks she may have developed asthma. Rinses mouth after using symbicort. She has never had a blood clot. Not taking any birth control pills.  ? ?She was tested for flu and covid, negative. She wasn't vaccinated for covid. ? ?Never had asthma as a kid that she was aware of but did have inhaler.  ? ?Last time she needed steroids for ITP was years ago.  ? ?No family history of lung disease. ? ?She goes to school online. She does jewelry sales/. She has never lived outside of [place], no recent travel. No pets at home ? ?Interval HPI: ?Since last visit with some waxing and waning ggos on CT chest, enlargement of dominant 1.4cm LLL nodule. Rheum labs negative.  ? ?She hasn't had any other recent infections. No fever, drenching night sweats, weight loss. She had had a  ? ?She has always lived in [place]. She had been to [place] stayed in the city - this preceded her bouts of bronchitis last year.   ? ?Otherwise pertinent review of systems is negative. ? ?Past Medical History:  ?Diagnosis Date  ? Idiopathic thrombocytopenic purpura (ITP) (HCC)   ? SVT (supraventricular tachycardia) ([place])   ?  ? ?Family History  ?Problem Relation Age of Onset  ? Healthy Mother   ? Healthy Father   ? Cancer Paternal Uncle   ? Breast cancer Maternal Grandmother   ?  Colon cancer Paternal Grandfather   ?  ? ?No past surgical history on file. ? ?Social History  ? ?Socioeconomic History  ? Marital status: Single  ?  Spouse name: Not on file  ? Number of children: Not on file  ? Years of education: Not on file  ? Highest education level: Not on file  ?Occupational History  ? Not on file  ?Tobacco Use  ? Smoking status: Never  ? Smokeless tobacco: Never  ?Substance and Sexual Activity  ? Alcohol use: Never  ? Drug use: Never  ? Sexual activity: Not on file  ?Other Topics Concern  ? Not on file  ?Social History Narrative  ? Not on file  ? ?Social Determinants of Health  ? ?Financial Resource Strain: Not on file  ?Food Insecurity: Not on file  ?Transportation Needs: Not on file  ?Physical Activity: Not on file  ?Stress: Not on file  ?Social Connections: Not on file  ?Intimate Partner Violence: Not on file  ?  ? ?No Known Allergies  ? ?Outpatient Medications Prior to Visit  ?Medication Sig Dispense Refill  ? Albuterol Sulfate, sensor, (PROAIR DIGIHALER) 108 (90 Base) MCG/ACT AEPB Inhale 1-2 puffs into the lungs every 4 (four) hours as needed. 1 each 0  ? Ruxolitinib Phosphate (OPZELURA) 1.5 % CREA Apply 1 application topically 2 (two) times daily. 60 g 1  ?  budesonide-formoterol (SYMBICORT) 160-4.5 MCG/ACT inhaler Inhale 2 puffs into the lungs daily. (Patient not taking: Reported on 08/18/2021) 1 each 12  ? ?No facility-administered medications prior to visit.  ? ? ? ? ? ?Objective:  ? ?Physical Exam: ? ?General appearance: 21 y.o., female, NAD, conversant  ?Eyes: anicteric sclerae; PERRL, tracking appropriately ?HENT: NCAT; MMM, erythematous area over roof of mouth with whitish papules coalescing to plaque ?Neck: Trachea midline; no lymphadenopathy, no JVD ?Lungs: CTAB, no crackles, no wheeze, with normal respiratory effort ?CV: RRR, no murmur  ?Abdomen: Soft, non-tender; non-distended, BS present  ?Extremities: No peripheral edema, warm ?Skin: Normal turgor and texture; no  rash ?Psych: Appropriate affect ?Neuro: Alert and oriented to person and place, no focal deficit  ? ? ? ?Vitals:  ? 10/27/21 1449  ?BP: 106/70  ?Pulse: 83  ?Temp: 98.2 ?F (36.8 ?C)  ?TempSrc: Oral  ?SpO2: 100%  ?Weight: 116 lb 12.8 oz (53 kg)  ?Height: _0  (1.6 m)  ? ? ? ?100% on RA ?BMI Readings from Last 3 Encounters:  ?10/27/21 20.69 kg/m?  ?08/18/21 20.90 kg/m?  ?06/29/21 20.73 kg/m?  ? ?Wt Readings from Last 3 Encounters:  ?10/27/21 116 lb 12.8 oz (53 kg)  ?08/18/21 118 lb (53.5 kg)  ?06/29/21 117 lb (53.1 kg)  ? ? ? ?CBC ?   ?Component Value Date/Time  ? WBC 8.6 12/11/2020 1904  ? RBC 4.89 12/11/2020 1904  ? HGB 12.4 12/11/2020 1904  ? HGB 11.7 11/26/2020 1407  ? HCT 39.1 12/11/2020 1904  ? HCT 36.5 11/26/2020 1407  ? PLT 172 12/11/2020 1904  ? PLT 137 (L) 11/26/2020 1407  ? MCV 80.0 12/11/2020 1904  ? MCV 78 (L) 11/26/2020 1407  ? MCH 25.4 (L) 12/11/2020 1904  ? MCHC 31.7 12/11/2020 1904  ? RDW 14.4 12/11/2020 1904  ? RDW 14.4 11/26/2020 1407  ? LYMPHSABS 3.1 12/11/2020 1904  ? LYMPHSABS 1.5 11/26/2020 1407  ? MONOABS 0.7 12/11/2020 1904  ? EOSABS 0.1 12/11/2020 1904  ? EOSABS 0.1 11/26/2020 1407  ? BASOSABS 0.0 12/11/2020 1904  ? BASOSABS 0.0 11/26/2020 1407  ? ? ?Eos unremarkable ? ?RF, CCP, ANCAs, ENA panel all negative  ? ?Chest Imaging: ? ?CXR 06/10/21 reviewed by me unremarkable ? ?CXR 12/11/20 reviewed by me unremarkable ? ?CT Chest 12/27 reviewed by me remarkable for several nodular opacities largest 58m some with surrounding ggo ? ? ?Pulmonary Functions Testing Results: ? ?  Latest Ref Rng & Units 08/18/2021  ? 10:49 AM  ?PFT Results  ?FVC-Pre L 3.20    ?FVC-Predicted Pre % 87    ?FVC-Post L 3.34    ?FVC-Predicted Post % 91    ?Pre FEV1/FVC % % 83    ?Post FEV1/FCV % % 86    ?FEV1-Pre L 2.65    ?FEV1-Predicted Pre % 82    ?FEV1-Post L 2.86    ?DLCO uncorrected ml/min/mmHg 19.54    ?DLCO UNC% % 91    ?DLCO corrected ml/min/mmHg 19.54    ?DLCO COR %Predicted % 91    ?DLVA Predicted % 96    ?TLC L 4.34     ?TLC % Predicted % 88    ?RV % Predicted % 112    ? ?Reviewed by me with borderline elevated RV/TLC suggestive of air trapping but normal FVC/SVC ? ?   ?Assessment & Plan:  ? ?# Pleuritic chest pain: ?Due to rib fracture. Largely resolved. ? ?# Pulmonary nodules ?# Subacute cough ?Overall agree that waxing/waning nature of nodules/ggo  favors infectious/inflammatory process but interval growth of LLL nodule is concerning. Considerations include recurrent atypical pneumonia or viral LRTIs some of which may have been asymptomatic, endemic fungal infection. ILD with organizing pneumonia an alternative possibility but CTD auto-antibodies are negative. ILD has been reported in association with ITP however not typically with OP pattern. Never smoker. ? ?# History of covid-19 infection ? ?Plan: ?- cbc today given history of ITP ?- robotic bronchoscopy under general. Need to send BAL for cell count/diff, cultures as well and would send TBLB or TBNA sample for micro (AFB, fungal, respiratory cultures) in addition to cytology ? ? ?RTC 1 month ? ?Maryjane Hurter, MD ?[facility] ?10/27/2021 3:03 PM  ? ?

## 2021-10-25 NOTE — Progress Notes (Signed)
? ?Synopsis: Referred for walking pneumonia by Jerrol Banana.,* ? ?Subjective:  ? ?PATIENT ID: Destiny Phillips, MRN: [id number] ? ?Chief Complaint  ?Patient presents with  ? Follow-up  ?  Breathing is doing well and she denies any new co's.   ? ?20yF with history of SVT, ITP who is referred for walking pneumonia, chronic cough. ? ?She says that she's had a cough for about a month. Got prednisone and z-pack and then doxy. Cough has improved and chest congestion has improved as well.  ? ?She has some pleuritic left sided CP over the last couple of weeks. Not coughing up any blood. She has no dyspnea. Started symbicort a couple of weeks ago. PCP thinks she may have developed asthma. Rinses mouth after using symbicort. She has never had a blood clot. Not taking any birth control pills.  ? ?She was tested for flu and covid, negative. She wasn't vaccinated for covid. ? ?Never had asthma as a kid that she was aware of but did have inhaler.  ? ?Last time she needed steroids for ITP was years ago.  ? ?No family history of lung disease. ? ?She goes to school online. 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 No pets at home ? ?Interval HPI: ?Since last visit with some waxing and waning ggos on CT chest, enlargement of dominant 1.4cm LLL nodule. Rheum labs negative.  ? ?She hasn't had any other recent infections. No fever, drenching night sweats, weight loss. She had had a  ? ?She has always lived in [place]. She had been to [place] stayed in the city - this preceded her bouts of bronchitis last year.   ? ?Otherwise pertinent review of systems is negative. ? ?Past Medical History:  ?Diagnosis Date  ? Idiopathic thrombocytopenic purpura (ITP) (HCC)   ? SVT (supraventricular tachycardia) ([place])   ?  ? ?Family History  ?Problem Relation Age of Onset  ? Healthy Mother   ? Healthy Father   ? Cancer Paternal Uncle   ? Breast cancer Maternal Grandmother   ?  Colon cancer Paternal Grandfather   ?  ? ?No past surgical history on file. ? ?Social History  ? ?Socioeconomic History  ? Marital status: Single  ?  Spouse name: Not on file  ? Number of children: Not on file  ? Years of education: Not on file  ? Highest education level: Not on file  ?Occupational History  ? Not on file  ?Tobacco Use  ? Smoking status: Never  ? Smokeless tobacco: Never  ?Substance and Sexual Activity  ? Alcohol use: Never  ? Drug use: Never  ? Sexual activity: Not on file  ?Other Topics Concern  ? Not on file  ?Social History Narrative  ? Not on file  ? ?Social Determinants of Health  ? ?Financial Resource Strain: Not on file  ?Food Insecurity: Not on file  ?Transportation Needs: Not on file  ?Physical Activity: Not on file  ?Stress: Not on file  ?Social Connections: Not on file  ?Intimate Partner Violence: Not on file  ?  ? ?No Known Allergies  ? ?Outpatient Medications Prior to Visit  ?Medication Sig Dispense Refill  ? Albuterol Sulfate, sensor, (PROAIR DIGIHALER) 108 (90 Base) MCG/ACT AEPB Inhale 1-2 puffs into the lungs every 4 (four) hours as needed. 1 each 0  ? Ruxolitinib Phosphate (OPZELURA) 1.5 % CREA Apply 1 application topically 2 (two) times daily. 60 g 1  ?  budesonide-formoterol (SYMBICORT) 160-4.5 MCG/ACT inhaler Inhale 2 puffs into the lungs daily. (Patient not taking: Reported on 08/18/2021) 1 each 12  ? ?No facility-administered medications prior to visit.  ? ? ? ? ? ?Objective:  ? ?Physical Exam: ? ?General appearance: 21 y.o., female, NAD, conversant  ?Eyes: anicteric sclerae; PERRL, tracking appropriately ?HENT: NCAT; MMM, erythematous area over roof of mouth with whitish papules coalescing to plaque ?Neck: Trachea midline; no lymphadenopathy, no JVD ?Lungs: CTAB, no crackles, no wheeze, with normal respiratory effort ?CV: RRR, no murmur  ?Abdomen: Soft, non-tender; non-distended, BS present  ?Extremities: No peripheral edema, warm ?Skin: Normal turgor and texture; no  rash ?Psych: Appropriate affect ?Neuro: Alert and oriented to person and place, no focal deficit  ? ? ? ?Vitals:  ? 10/27/21 1449  ?BP: 106/70  ?Pulse: 83  ?Temp: 98.2 ?F (36.8 ?C)  ?TempSrc: Oral  ?SpO2: 100%  ?Weight: 116 lb 12.8 oz (53 kg)  ?Height: _0  (1.6 m)  ? ? ? ?100% on RA ?BMI Readings from Last 3 Encounters:  ?10/27/21 20.69 kg/m?  ?08/18/21 20.90 kg/m?  ?06/29/21 20.73 kg/m?  ? ?Wt Readings from Last 3 Encounters:  ?10/27/21 116 lb 12.8 oz (53 kg)  ?08/18/21 118 lb (53.5 kg)  ?06/29/21 117 lb (53.1 kg)  ? ? ? ?CBC ?   ?Component Value Date/Time  ? WBC 8.6 12/11/2020 1904  ? RBC 4.89 12/11/2020 1904  ? HGB 12.4 12/11/2020 1904  ? HGB 11.7 11/26/2020 1407  ? HCT 39.1 12/11/2020 1904  ? HCT 36.5 11/26/2020 1407  ? PLT 172 12/11/2020 1904  ? PLT 137 (L) 11/26/2020 1407  ? MCV 80.0 12/11/2020 1904  ? MCV 78 (L) 11/26/2020 1407  ? MCH 25.4 (L) 12/11/2020 1904  ? MCHC 31.7 12/11/2020 1904  ? RDW 14.4 12/11/2020 1904  ? RDW 14.4 11/26/2020 1407  ? LYMPHSABS 3.1 12/11/2020 1904  ? LYMPHSABS 1.5 11/26/2020 1407  ? MONOABS 0.7 12/11/2020 1904  ? EOSABS 0.1 12/11/2020 1904  ? EOSABS 0.1 11/26/2020 1407  ? BASOSABS 0.0 12/11/2020 1904  ? BASOSABS 0.0 11/26/2020 1407  ? ? ?Eos unremarkable ? ?RF, CCP, ANCAs, ENA panel all negative  ? ?Chest Imaging: ? ?CXR 06/10/21 reviewed by me unremarkable ? ?CXR 12/11/20 reviewed by me unremarkable ? ?CT Chest 12/27 reviewed by me remarkable for several nodular opacities largest 10m some with surrounding ggo ? ? ?Pulmonary Functions Testing Results: ? ?  Latest Ref Rng & Units 08/18/2021  ? 10:49 AM  ?PFT Results  ?FVC-Pre L 3.20    ?FVC-Predicted Pre % 87    ?FVC-Post L 3.34    ?FVC-Predicted Post % 91    ?Pre FEV1/FVC % % 83    ?Post FEV1/FCV % % 86    ?FEV1-Pre L 2.65    ?FEV1-Predicted Pre % 82    ?FEV1-Post L 2.86    ?DLCO uncorrected ml/min/mmHg 19.54    ?DLCO UNC% % 91    ?DLCO corrected ml/min/mmHg 19.54    ?DLCO COR %Predicted % 91    ?DLVA Predicted % 96    ?TLC L 4.34     ?TLC % Predicted % 88    ?RV % Predicted % 112    ? ?Reviewed by me with borderline elevated RV/TLC suggestive of air trapping but normal FVC/SVC ? ?   ?Assessment & Plan:  ? ?# Pleuritic chest pain: ?Due to rib fracture. Largely resolved. ? ?# Pulmonary nodules ?# Subacute cough ?Overall agree that waxing/waning nature of nodules/ggo  favors infectious/inflammatory process but interval growth of LLL nodule is concerning. Considerations include recurrent atypical pneumonia or viral LRTIs some of which may have been asymptomatic, endemic fungal infection. ILD with organizing pneumonia an alternative possibility but CTD auto-antibodies are negative. ILD has been reported in association with ITP however not typically with OP pattern. Never smoker. ? ?# History of covid-19 infection ? ?Plan: ?- cbc today given history of ITP ?- robotic bronchoscopy under general. Need to send BAL for cell count/diff, cultures as well and would send TBLB or TBNA sample for micro (AFB, fungal, respiratory cultures) in addition to cytology ? ? ?RTC 1 month ? ?Maryjane Hurter, MD ?[facility] ?10/27/2021 3:03 PM  ? ?

## 2021-10-27 ENCOUNTER — Encounter: Payer: Self-pay | Admitting: Student

## 2021-10-27 ENCOUNTER — Ambulatory Visit (INDEPENDENT_AMBULATORY_CARE_PROVIDER_SITE_OTHER): Payer: Managed Care, Other (non HMO) | Admitting: Student

## 2021-10-27 ENCOUNTER — Telehealth: Payer: Self-pay | Admitting: Student

## 2021-10-27 VITALS — BP 106/70 | HR 83 | Temp 98.2°F | Ht 63.0 in | Wt 116.8 lb

## 2021-10-27 DIAGNOSIS — R918 Other nonspecific abnormal finding of lung field: Secondary | ICD-10-CM

## 2021-10-27 DIAGNOSIS — D693 Immune thrombocytopenic purpura: Secondary | ICD-10-CM | POA: Diagnosis not present

## 2021-10-27 NOTE — Telephone Encounter (Addendum)
Robotic bronch has been sched for 5/1 at 7:30 - case # [id number].  CT sched for 4/28 and pt can get covid that day.  I called pt to give her info and she was driving.  Called her back & left my phone # so she can call me back and I can give her all appt info.   ?

## 2021-10-27 NOTE — Patient Instructions (Signed)
- You will be called to schedule robotic bronchoscopy with either Dr. Lamonte Sakai or Dr. Valeta Harms. Nothing to eat or drink the night before the procedure starting at midnight.  ?- labs today ? ? ?Flexible Bronchoscopy ?Flexible bronchoscopy is a procedure used to examine the passageways in the lungs. During the procedure, a thin, flexible tool with a camera (bronchoscope) is passed into the mouth or nose, down through the windpipe (trachea), and into the air tubes in the lungs (bronchi). This tool allows the health care provider to look inside the lungs and to take samples for testing, if needed. ?Tell a health care provider about: ?Any allergies you have. ?All medicines you are taking, including vitamins, herbs, eye drops, creams, and over-the-counter medicines. ?Any problems you or family members have had with anesthetic medicines. ?Any blood disorders you have. ?Any surgeries you have had. ?Any medical conditions you have. ?Whether you are pregnant or may be pregnant. ?What are the risks? ?Generally, this is a safe procedure. However, problems may occur, including: ?Infection. ?Bleeding. ?Damage to other structures or organs. ?Allergic reactions to medicines. ?Collapsed lung (pneumothorax). ?Increased need for oxygen or difficulty breathing after the procedure. ?What happens before the procedure? ?Staying hydrated ?Follow instructions from your health care provider about hydration, which may include: ?Up to 2 hours before the procedure - you may continue to drink clear liquids, such as water, clear fruit juice, black coffee, and plain tea. ? ?Eating and drinking restrictions ?Follow instructions from your health care provider about eating and drinking, which may include: ?8 hours before the procedure - stop eating heavy meals or foods, such as meat, fried foods, or fatty foods. ?6 hours before the procedure - stop eating light meals or foods, such as toast or cereal. ?6 hours before the procedure - stop drinking milk or  drinks that contain milk. ?2 hours before the procedure - stop drinking clear liquids. ?Medicines ?Ask your health care provider about: ?Changing or stopping your regular medicines. This is especially important if you are taking diabetes medicines or blood thinners. ?Taking medicines such as aspirin and ibuprofen. These medicines can thin your blood. Do not take these medicines unless your health care provider tells you to take them. ?Taking over-the-counter medicines, vitamins, herbs, and supplements. ?General instructions ?You may be given antibiotic medicine to help lower the risk of infection. ?Plan to have a responsible adult take you home from the hospital or clinic. ?If you will be going home right after the procedure, plan to have a responsible adult care for you for the time you are told. This is important. ?What happens during the procedure? ?An IV will be inserted into one of your veins. ?You will be given a medicine (local anesthetic) to numb your mouth, nose, throat, and voice box (larynx). You may also be given one or more of the following: ?A medicine to help you relax (sedative). ?A medicine to control coughing. ?A medicine to dry up any fluids or secretions in your lungs. ?A bronchoscope will be passed into your nose or mouth, and into your lungs. Your health care provider will examine your lungs. ?Samples of airway secretions may be collected for testing. ?If abnormal areas are seen in your airways, samples of tissue may be removed and checked under a microscope (biopsy). ?If tissue samples are needed from the outer parts of the lung, a type of X-ray (fluoroscopy) may be used to guide the bronchoscope to these areas. ?If bleeding occurs, you may be given medicine to stop  or decrease the bleeding. ?The procedure may vary among health care providers and hospitals. ?What can I expect after the procedure? ?Your blood pressure, heart rate, breathing rate, and blood oxygen level will be monitored until  you leave the hospital or clinic. ?You may have a chest X-ray to check for signs of pneumothorax. ?You willnot be allowed to eat or drink anything for 2 hours after your procedure. ?If a biopsy was taken, it is up to you to get the results of the test. Ask your health care provider, or the department that is doing the procedure, when your results will be ready. ?You may have the following symptoms for 24-48 hours: ?A cough that is worse than it was before the procedure. ?A low-grade fever. ?A sore throat or hoarse voice. ?Some blood in the mucus from your lungs (sputum), if a biopsy was done. ?Follow these instructions at home: ?Eating and drinking ?Do not eat or drink anything, including water, for 2 hours after your procedure, or until your numbing medicine has worn off. Having a numb throat increases your risk of burning yourself or choking. ?Start eating soft foods and slowly drinking liquids after your numbness is gone and your cough and gag reflexes have returned. ?You may return to your normal diet the day after the procedure. ?Driving ?If you were given a sedative during the procedure, it can affect you for several hours. Do not drive or operate machinery until your health care provider says that it is safe. ?Ask your health care provider if the medicine prescribed to you requires you to avoid driving or using machinery. ?Return to your normal activities as told by your health care provider. Ask your health care provider what activities are safe for you. ?General instructions ? ?Take over-the-counter and prescription medicines only as told by your health care provider. ?Do not use any products that contain nicotine or tobacco. These products include cigarettes, chewing tobacco, and vaping devices, such as e-cigarettes. If you need help quitting, ask your health care provider. ?Keep all follow-up visits. This is important. ?Get help right away if: ?You have shortness of breath that gets worse. ?You become  light-headed or feel like you might faint. ?You have chest pain. ?You cough up more than a small amount of blood. ?These symptoms may represent a serious problem that is an emergency. Do not wait to see if the symptoms will go away. Get medical help right away. Call your local emergency services (911 in the [place] Do not drive yourself to the hospital. ?Summary ?Flexible bronchoscopy is a procedure that allows your health care provider to look closely inside your lungs and to take testing samples if needed. ?Risks of flexible bronchoscopy include bleeding, infection, and collapsed lung (pneumothorax). ?Before the procedure, you will be given a medicine to numb your mouth, nose, throat, and voice box. Then, a bronchoscope will be passed into your nose or mouth, and into your lungs. ?After the procedure, your blood pressure, heart rate, breathing rate, and blood oxygen level will be monitored until you leave the hospital or clinic. You may have a chest X-ray to check for signs of pneumothorax. ?You will not be allowed to eat or drink anything for 2 hours after your procedure. ?This information is not intended to replace advice given to you by your health care provider. Make sure you discuss any questions you have with your health care provider. ?Document Revised: 03/18/2021 Document Reviewed: 01/24/2020 ?Elsevier Patient Education ? [place]. ? ?

## 2021-10-27 NOTE — Telephone Encounter (Signed)
Can you schedule robotic bronch with Dr. Delton Coombes for left lower lobe nodule? Discussed this case with him a couple weeks ago. Order placed for Super D CT Chest. ? ?Please schedule the following: ? ?Provider performing procedure:[facility] ?Diagnosis: Pulmonary nodules ?Which side if for nodule / mass? LLL is dominant nodule ?Procedure: robotic bronchoscopy   ?Has patient been spoken to by Provider and given informed consent? yes ?Anesthesia: general ?Do you need Fluro? yes ?Duration of procedure: 1.5hr ?Date: Next available for Dr. Delton Coombes - 4/24 or 5/1 ?Alternate Date: as above  ?Time: Any ?Location: [facility] ?Does patient have OSA? no DM? no Or Latex allergy? no ?Medication Restriction/ Anticoagulate/Antiplatelet: None ?Pre-op Labs Ordered:determined by Anesthesia ?Imaging request: Needs CBC (obtaining today) given history of ITP  ?(If, SuperDimension CT Chest, please have STAT courier sent to ENDO) ? ?Please coordinate Pre-op COVID Testing   ?

## 2021-10-28 LAB — CBC WITH DIFFERENTIAL/PLATELET
Basophils Absolute: 0 10*3/uL (ref 0.0–0.1)
Basophils Relative: 1 % (ref 0.0–3.0)
Eosinophils Absolute: 0.1 10*3/uL (ref 0.0–0.7)
Eosinophils Relative: 2.3 % (ref 0.0–5.0)
HCT: 36.8 % (ref 36.0–46.0)
Hemoglobin: 12 g/dL (ref 12.0–15.0)
Lymphocytes Relative: 39.5 % (ref 12.0–46.0)
Lymphs Abs: 1.6 10*3/uL (ref 0.7–4.0)
MCHC: 32.6 g/dL (ref 30.0–36.0)
MCV: 76.8 fl — ABNORMAL LOW (ref 78.0–100.0)
Monocytes Absolute: 0.3 10*3/uL (ref 0.1–1.0)
Monocytes Relative: 6.6 % (ref 3.0–12.0)
Neutro Abs: 2 10*3/uL (ref 1.4–7.7)
Neutrophils Relative %: 50.6 % (ref 43.0–77.0)
Platelets: 139 10*3/uL — ABNORMAL LOW (ref 150.0–400.0)
RBC: 4.79 Mil/uL (ref 3.87–5.11)
RDW: 13.9 % (ref 11.5–15.5)
WBC: 3.9 10*3/uL — ABNORMAL LOW (ref 4.0–10.5)

## 2021-10-28 NOTE — Telephone Encounter (Signed)
Spoke to pt and she will be out of town on 4/28 and can't go for covid test and CT on that date.  Resched CT to 4/26 and moved procedure to 12:45.  She will get covid test on 5/1 prior to procedure.  Gave pt all appt info.   ?

## 2021-11-11 ENCOUNTER — Ambulatory Visit (INDEPENDENT_AMBULATORY_CARE_PROVIDER_SITE_OTHER)
Admission: RE | Admit: 2021-11-11 | Discharge: 2021-11-11 | Disposition: A | Discharge status: Home / Self Care | Payer: Managed Care, Other (non HMO) | Source: Ambulatory Visit | Attending: Student | Admitting: Student

## 2021-11-11 ENCOUNTER — Ambulatory Visit: Payer: BC Managed Care – PPO | Admitting: Student

## 2021-11-11 DIAGNOSIS — R918 Other nonspecific abnormal finding of lung field: Secondary | ICD-10-CM | POA: Diagnosis not present

## 2021-11-12 ENCOUNTER — Ambulatory Visit: Payer: BC Managed Care – PPO | Admitting: Student

## 2021-11-13 ENCOUNTER — Encounter (HOSPITAL_COMMUNITY): Payer: Self-pay | Admitting: Emergency Medicine

## 2021-11-13 ENCOUNTER — Other Ambulatory Visit: Payer: Managed Care, Other (non HMO)

## 2021-11-13 NOTE — Progress Notes (Signed)
Anesthesia Chart Review: ? Case: [id number] Date/Time: 11/16/21 1245  ? Procedure: ROBOTIC ASSISTED NAVIGATIONAL BRONCHOSCOPY  ? Anesthesia type: General  ? Pre-op diagnosis: PULMONARY NODULES  ? Location: [facility] / [facility]  ? Surgeons: Leslye Peer, MD  ? ?  ? 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  ? ?Per cardiology note 10/31/2018 her SVT dates back to when she was 21 years old.  Had an episode of SVT at age 14 that required adenosine for restoration of sinus rhythm.  At that time she was started on atenolol.  Went several years without a sustained episode.  Then had SVT when 21 years old and had a narrow complex SVT at the time at 201 bpm which she required 3 doses of an adenosine in order to terminate the arrhythmia.  She has been successful in terminating her episodes with vagal maneuvers.  By report her SVT was a short RP tachycardia. 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 No recurrence since ablation and BB was stopped.  ? ?Referred to pulmonology 06/29/2021 for walking pneumonia and chronic cough.  At the time she had pleuritic left sided chest pain.  CT chest without contrast ordered and PFTs.  CTA chest with enlargement of dominant 1.4 cm LLL nodule and set up for bronchoscopy.  ? ? ?VS: LMP 10/24/2021 (Exact Date)  ? ?  10/27/2021  ?  2:49 PM 08/18/2021  ? 11:55 AM 06/29/2021  ?  9:35 AM  ?Vitals with BMI  ?Height 5\' 3"  5\' 3"  5\' 3"   ?Weight 116 lbs 13 oz 118 lbs 117 lbs  ?BMI 20.7 20.91 20.73  ?Systolic 106 118  ?Diastolic 70 68 70  ?Pulse 83 91 86  ? ? ?PROVIDERS: ? ., MD ? ?IMAGES: ? ?CT Chest WO Contrast (11/13/21) ?IMPRESSION: ?1. Stable dominant solid nodule in the left lower lobe measuring 1.4 ?by 1.0 cm on image 109 series 3. ?2. Waxing and waning ground-glass density nodules in both  lungs, ?including resolution of some prior nodules, some new nodules, some ?nodules remaining stable, and some nodules demonstrating increase or ?decrease in size/prominence. This appearance of the ground-glass ?density nodules would tend to favor a inflammatory or infectious ?process particularly in light of the patient's age. ?3. Prominent upper margin of the spleen raising suspicion for ?potential splenomegaly. ?  ?PFTs 08/18/2021 ?Pulmonary Functions Testing Results: ?  ?  Latest Ref Rng & Units 08/18/2021  ? 10:49 AM  ?PFT Results  ?FVC-Pre L 3.20    ?FVC-Predicted Pre % 87    ?FVC-Post L 3.34    ?FVC-Predicted Post % 91    ?Pre FEV1/FVC % % 83    ?Post FEV1/FCV % % 86    ?FEV1-Pre L 2.65    ?FEV1-Predicted Pre % 82    ?FEV1-Post L 2.86    ?DLCO uncorrected ml/min/mmHg 19.54    ?DLCO UNC% % 91    ?DLCO corrected ml/min/mmHg 19.54    ?DLCO COR %Predicted % 91    ?DLVA Predicted % 96    ?TLC L 4.34    ?TLC % Predicted % 88    ?RV % Predicted % 112    ?  ?Per pulm note: "Reviewed by me with borderline elevated RV/TLC suggestive of air trapping but normal FVC/SVC"  ? ?Past Medical History:  ?Diagnosis Date  ?  AICD (automatic cardioverter/defibrillator) present   ? Idiopathic thrombocytopenic purpura (ITP) (HCC)   ? SVT (supraventricular tachycardia) (HCC)   ? ? ?No past surgical history on file. ? ?MEDICATIONS: ?No current facility-administered medications for this encounter.  ? ? Bacillus Coagulans-Inulin (PROBIOTIC-PREBIOTIC PO)  ? Albuterol Sulfate, sensor, (PROAIR DIGIHALER) 108 (90 Base) MCG/ACT AEPB  ? Ruxolitinib Phosphate (OPZELURA) 1.5 % CREA  ? ? ?Ulla Potash, DNP, CRNA, NP-C ?Short Stay/Anesthesia ? ? ? ? ? ? ?

## 2021-11-13 NOTE — Anesthesia Preprocedure Evaluation (Addendum)
Anesthesia Evaluation  ?Patient identified by MRN, date of birth, ID band ?Patient awake ? ? ? ?Reviewed: ?NPO status , Patient's Chart, lab work & pertinent test results ? ?Airway ?Mallampati: II ? ?TM Distance: >3 FB ?Neck ROM: Full ? ? ? Dental ?no notable dental hx. ? ?  ?Pulmonary ? ?pulm nodules ?  ?Pulmonary exam normal ? ? ? ? ? ? ? Cardiovascular ?negative cardio ROS ? ?+ dysrhythmias (s/p ablation) Supra Ventricular Tachycardia  ?Rhythm:Regular Rate:Normal ? ? ?  ?Neuro/Psych ?negative neurological ROS ? negative psych ROS  ? GI/Hepatic ?negative GI ROS, Neg liver ROS,   ?Endo/Other  ?negative endocrine ROS ? Renal/GU ?negative Renal ROS  ?negative genitourinary ?  ?Musculoskeletal ?negative musculoskeletal ROS ?(+)  ? Abdominal ?Normal abdominal exam  (+)   ?Peds ? Hematology ? ?(+) Blood dyscrasia, , ITP   ?Anesthesia Other Findings ? ? Reproductive/Obstetrics ? ?  ? ? ? ? ? ? ? ? ? ? ? ? ? ?  ?  ? ? ? ? ? ? ? ? ?Anesthesia Physical ?Anesthesia Plan ? ?ASA: 2 ? ?Anesthesia Plan: General  ? ?Post-op Pain Management:   ? ?Induction: Intravenous ? ?PONV Risk Score and Plan: 3 and Ondansetron, Dexamethasone, Midazolam and Treatment may vary due to age or medical condition ? ?Airway Management Planned: Mask and Oral ETT ? ?Additional Equipment: None ? ?Intra-op Plan:  ? ?Post-operative Plan: Extubation in OR ? ?Informed Consent: I have reviewed the patients History and Physical, chart, labs and discussed the procedure including the risks, benefits and alternatives for the proposed anesthesia with the patient or authorized representative who has indicated his/her understanding and acceptance.  ? ? ? ?Dental advisory given ? ?Plan Discussed with: CRNA ? ?Anesthesia Plan Comments: (Lab Results ?     Component                Value               Date                 ?     WBC                      3.9 (L)             10/27/2021           ?     HGB                      12.0                 10/27/2021           ?     HCT                      36.8                10/27/2021           ?     MCV                      76.8 (L)            10/27/2021           ?     PLT                      139.0 (L)  10/27/2021           ?Lab Results ?     Component                Value               Date                 ?     NA                       134 (L)             12/11/2020           ?     K                        3.8                 12/11/2020           ?     CO2                      28                  12/11/2020           ?     GLUCOSE                  90                  12/11/2020           ?     BUN                      15                  12/11/2020           ?     CREATININE               0.75                12/11/2020           ?     CALCIUM                  9.4                 12/11/2020           ?     GFRNONAA                 >60                 12/11/2020           ?Lab Results ?     Component                Value               Date                 ?     HCG                      <5.0                12/11/2020          )  ? ? ? ? ? ? ?  Anesthesia Quick Evaluation ? ?Please refer to progress note 11/13/21 for Anesthesia Chart Review. Ulla Potash, DNP, CRNA, NP-C ?Short Stay/Anesthesia ? ?

## 2021-11-13 NOTE — Progress Notes (Signed)
TWO VISITORS ARE ALLOWED TO COME WITH YOU AND STAY IN THE SURGICAL WAITING ROOM ONLY DURING PRE OP AND PROCEDURE DAY OF SURGERY.  ? ?PCP - Dr Miguel Aschoff ?Cardiologist - [facility] Tonye Royalty, NP, ?Pulmonology - Dr Leslye Peer ? ?CT Chest x-ray - 11/11/21 ?EKG - 12/11/20 ?Stress Test - n/a ?PED ECHO - 01/09/13 ?Cardiac Cath - n/a ? ?ICD Pacemaker/Loop - n/a ? ?Sleep Study -  n/a ?CPAP - none ? ?Anesthesia review: Yes ? ?STOP now taking any Aspirin (unless otherwise instructed by your surgeon), Aleve, Naproxen, Ibuprofen, Motrin, Advil, Goody's, BC's, all herbal medications, fish oil, and all vitamins.  ? ?Coronavirus Screening ?Covid test on DOS ?Do you have any of the following symptoms:  ?Cough yes/no: No ?Fever (>100.42F)  yes/no: No ?Runny nose yes/no: No ?Sore throat yes/no: No ?Difficulty breathing/shortness of breath  yes/no: No ? ?Have you traveled in the last 14 days and where? yes/no: No ? ?Patient verbalized understanding of instructions that were given via phone. ?

## 2021-11-15 ENCOUNTER — Encounter: Payer: Self-pay | Admitting: Student

## 2021-11-15 NOTE — Progress Notes (Signed)
Pt. Called short Stay, reporting not feeling well. Stated she has a stuffy nose,cough,nausea and clear mucus coming from nose. Instructed to call Dr. Kavin Leech office to speak with on call doctor and to be further advised. ?

## 2021-11-16 ENCOUNTER — Ambulatory Visit (HOSPITAL_COMMUNITY): Payer: Managed Care, Other (non HMO)

## 2021-11-16 ENCOUNTER — Encounter (HOSPITAL_COMMUNITY)
Admission: RE | Disposition: A | Discharge status: Home / Self Care | Payer: Self-pay | Source: Home / Self Care | Attending: Emergency Medicine

## 2021-11-16 ENCOUNTER — Other Ambulatory Visit: Payer: Self-pay

## 2021-11-16 ENCOUNTER — Ambulatory Visit (HOSPITAL_BASED_OUTPATIENT_CLINIC_OR_DEPARTMENT_OTHER): Payer: Managed Care, Other (non HMO) | Admitting: Anesthesiology

## 2021-11-16 ENCOUNTER — Encounter (HOSPITAL_COMMUNITY): Payer: Self-pay | Admitting: Emergency Medicine

## 2021-11-16 ENCOUNTER — Ambulatory Visit (HOSPITAL_COMMUNITY)
Admission: RE | Admit: 2021-11-16 | Discharge: 2021-11-16 | Disposition: A | Discharge status: Home / Self Care | Payer: Managed Care, Other (non HMO) | Attending: Emergency Medicine | Admitting: Emergency Medicine

## 2021-11-16 ENCOUNTER — Ambulatory Visit (HOSPITAL_COMMUNITY): Payer: Managed Care, Other (non HMO) | Admitting: Anesthesiology

## 2021-11-16 DIAGNOSIS — D693 Immune thrombocytopenic purpura: Secondary | ICD-10-CM | POA: Insufficient documentation

## 2021-11-16 DIAGNOSIS — Z8616 Personal history of COVID-19: Secondary | ICD-10-CM | POA: Diagnosis not present

## 2021-11-16 DIAGNOSIS — Z20822 Contact with and (suspected) exposure to covid-19: Secondary | ICD-10-CM | POA: Diagnosis not present

## 2021-11-16 DIAGNOSIS — R052 Subacute cough: Secondary | ICD-10-CM | POA: Diagnosis not present

## 2021-11-16 DIAGNOSIS — R918 Other nonspecific abnormal finding of lung field: Secondary | ICD-10-CM

## 2021-11-16 HISTORY — PX: BRONCHIAL BRUSHINGS: SHX5108

## 2021-11-16 HISTORY — PX: VIDEO BRONCHOSCOPY WITH RADIAL ENDOBRONCHIAL ULTRASOUND: SHX6849

## 2021-11-16 HISTORY — PX: BRONCHIAL WASHINGS: SHX5105

## 2021-11-16 HISTORY — PX: BRONCHIAL BIOPSY: SHX5109

## 2021-11-16 HISTORY — PX: BRONCHIAL NEEDLE ASPIRATION BIOPSY: SHX5106

## 2021-11-16 LAB — SARS CORONAVIRUS 2 BY RT PCR: SARS Coronavirus 2 by RT PCR: NEGATIVE

## 2021-11-16 LAB — POCT PREGNANCY, URINE: Preg Test, Ur: NEGATIVE

## 2021-11-16 SURGERY — BRONCHOSCOPY, WITH BIOPSY USING ELECTROMAGNETIC NAVIGATION
Anesthesia: General

## 2021-11-16 MED ORDER — PROPOFOL 500 MG/50ML IV EMUL
INTRAVENOUS | Status: DC | PRN
  Administered 2021-11-16: 100 ug/kg/min via INTRAVENOUS
  Administered 2021-11-16: 150 ug/kg/min via INTRAVENOUS

## 2021-11-16 MED ORDER — MIDAZOLAM HCL 2 MG/2ML IJ SOLN
INTRAMUSCULAR | Status: DC | PRN
  Administered 2021-11-16: 2 mg via INTRAVENOUS

## 2021-11-16 MED ORDER — ROCURONIUM BROMIDE 10 MG/ML (PF) SYRINGE
PREFILLED_SYRINGE | INTRAVENOUS | Status: DC | PRN
  Administered 2021-11-16: 20 mg via INTRAVENOUS
  Administered 2021-11-16: 60 mg via INTRAVENOUS

## 2021-11-16 MED ORDER — SUGAMMADEX SODIUM 200 MG/2ML IV SOLN
INTRAVENOUS | Status: DC | PRN
  Administered 2021-11-16: 200 mg via INTRAVENOUS

## 2021-11-16 MED ORDER — DEXAMETHASONE SODIUM PHOSPHATE 10 MG/ML IJ SOLN
INTRAMUSCULAR | Status: DC | PRN
Start: 2021-11-16 — End: 2021-11-16
  Administered 2021-11-16: 10 mg via INTRAVENOUS

## 2021-11-16 MED ORDER — CHLORHEXIDINE GLUCONATE 0.12 % MT SOLN
OROMUCOSAL | Status: AC
  Administered 2021-11-16: 15 mL via OROMUCOSAL
  Filled 2021-11-16: qty 15

## 2021-11-16 MED ORDER — LIDOCAINE 2% (20 MG/ML) 5 ML SYRINGE
INTRAMUSCULAR | Status: DC | PRN
  Administered 2021-11-16: 100 mg via INTRAVENOUS

## 2021-11-16 MED ORDER — FENTANYL CITRATE (PF) 250 MCG/5ML IJ SOLN
INTRAMUSCULAR | Status: DC | PRN
  Administered 2021-11-16: 100 ug via INTRAVENOUS
  Administered 2021-11-16: 50 ug via INTRAVENOUS

## 2021-11-16 MED ORDER — MIDAZOLAM HCL 2 MG/2ML IJ SOLN
INTRAMUSCULAR | Status: AC
  Filled 2021-11-16: qty 2

## 2021-11-16 MED ORDER — PHENYLEPHRINE 80 MCG/ML (10ML) SYRINGE FOR IV PUSH (FOR BLOOD PRESSURE SUPPORT)
PREFILLED_SYRINGE | INTRAVENOUS | Status: DC | PRN
Start: 2021-11-16 — End: 2021-11-16
  Administered 2021-11-16: 80 ug via INTRAVENOUS

## 2021-11-16 MED ORDER — CHLORHEXIDINE GLUCONATE 0.12 % MT SOLN: 15.0000 mL | Freq: Once | OROMUCOSAL | Status: AC

## 2021-11-16 MED ORDER — LACTATED RINGERS IV SOLN: INTRAVENOUS | Status: DC

## 2021-11-16 MED ORDER — FENTANYL CITRATE (PF) 250 MCG/5ML IJ SOLN
INTRAMUSCULAR | Status: AC
  Filled 2021-11-16: qty 5

## 2021-11-16 MED ORDER — ONDANSETRON HCL 4 MG/2ML IJ SOLN
INTRAMUSCULAR | Status: DC | PRN
  Administered 2021-11-16: 4 mg via INTRAVENOUS

## 2021-11-16 MED ORDER — PROPOFOL 10 MG/ML IV BOLUS
INTRAVENOUS | Status: DC | PRN
  Administered 2021-11-16: 120 mg via INTRAVENOUS

## 2021-11-16 NOTE — Transfer of Care (Signed)
Immediate Anesthesia Transfer of Care Note ? ?Patient: Destiny Phillips ? ?Procedure(s) Performed: ROBOTIC ASSISTED NAVIGATIONAL BRONCHOSCOPY ?BRONCHIAL BRUSHINGS ?VIDEO BRONCHOSCOPY WITH RADIAL ENDOBRONCHIAL ULTRASOUND ?BRONCHIAL NEEDLE ASPIRATION BIOPSIES ?BRONCHIAL BIOPSIES ?BRONCHIAL WASHINGS ? ?Patient Location: Endoscopy Unit ? ?Anesthesia Type:General ? ?Level of Consciousness: drowsy and patient cooperative ? ?Airway & Oxygen Therapy: Patient Spontanous Breathing and Patient connected to face mask oxygen ? ?Post-op Assessment: Report given to RN and Post -op Vital signs reviewed and stable ? ?Post vital signs: Reviewed and stable ? ?Last Vitals:  ?Vitals Value Taken Time  ?BP 107/57 11/16/21 1519  ?Temp    ?Pulse 102 11/16/21 1524  ?Resp 17 11/16/21 1524  ?SpO2 98 % 11/16/21 1524  ?Vitals shown include unvalidated device data. ? ?Last Pain:  ?Vitals:  ? 11/16/21 1038  ?TempSrc:   ?PainSc: 0-No pain  ?   ? ?  ? ?Complications: No notable events documented. ?

## 2021-11-16 NOTE — Anesthesia Postprocedure Evaluation (Signed)
Anesthesia Post Note ? ?Patient: Destiny Phillips ? ?Procedure(s) Performed: ROBOTIC ASSISTED NAVIGATIONAL BRONCHOSCOPY ?BRONCHIAL BRUSHINGS ?VIDEO BRONCHOSCOPY WITH RADIAL ENDOBRONCHIAL ULTRASOUND ?BRONCHIAL NEEDLE ASPIRATION BIOPSIES ?BRONCHIAL BIOPSIES ?BRONCHIAL WASHINGS ? ?  ? ?Patient location during evaluation: PACU ?Anesthesia Type: General ?Level of consciousness: awake and alert ?Pain management: pain level controlled ?Vital Signs Assessment: post-procedure vital signs reviewed and stable ?Respiratory status: spontaneous breathing, nonlabored ventilation and respiratory function stable ?Cardiovascular status: blood pressure returned to baseline ?Postop Assessment: no apparent nausea or vomiting ?Anesthetic complications: no ? ? ?No notable events documented. ? ?Last Vitals:  ?Vitals:  ? 11/16/21 1530 11/16/21 1540  ?BP:  (!) 102/54  ?Pulse:  99  ?Resp:  18  ?Temp:    ?SpO2: 92% 93%  ?  ?Last Pain:  ?Vitals:  ? 11/16/21 1540  ?TempSrc:   ?PainSc: 0-No pain  ? ? ?  ?  ?  ?  ?  ?  ? ?Marthenia Rolling ? ? ? ? ?

## 2021-11-16 NOTE — Anesthesia Procedure Notes (Signed)
Procedure Name: Intubation ?Date/Time: 11/16/2021 1:48 PM ?Performed by: Thelma Comp, CRNA ?Pre-anesthesia Checklist: Patient identified, Emergency Drugs available, Suction available and Patient being monitored ?Patient Re-evaluated:Patient Re-evaluated prior to induction ?Oxygen Delivery Method: Circle System Utilized ?Preoxygenation: Pre-oxygenation with 100% oxygen ?Induction Type: IV induction ?Ventilation: Mask ventilation without difficulty ?Laryngoscope Size: Mac and 3 ?Grade View: Grade I ?Tube type: Oral ?Tube size: 8.5 mm ?Number of attempts: 1 ?Airway Equipment and Method: Stylet ?Placement Confirmation: ETT inserted through vocal cords under direct vision, positive ETCO2 and breath sounds checked- equal and bilateral ?Secured at: 21 cm ?Tube secured with: Tape ?Dental Injury: Teeth and Oropharynx as per pre-operative assessment  ? ? ? ? ?

## 2021-11-16 NOTE — Op Note (Signed)
Video Bronchoscopy with Robotic Assisted Bronchoscopic Navigation  ? ?Date of Operation: 11/16/2021  ? ?Pre-op Diagnosis: Bilateral pulmonary nodules ? ?Post-op Diagnosis: Same ? ?Surgeon: Baltazar Apo ? ?Assistants: None ? ?Anesthesia: General endotracheal anesthesia ? ?Operation: Flexible video fiberoptic bronchoscopy with robotic assistance and biopsies. ? ?Estimated Blood Loss: Minimal ? ?Complications: None apparent ? ?Indications and History: ?Destiny Phillips is a 21 y.o. female with history of bilateral waxing and waning pulmonary nodules that have been followed by Dr. Verlee Monte in our office.  Recommendation made to achieve culture data and a tissue diagnosis via robotic assisted navigational bronchoscopy. The risks, benefits, complications, treatment options and expected outcomes were discussed with the patient.  The possibilities of pneumothorax, pneumonia, reaction to medication, pulmonary aspiration, perforation of a viscus, bleeding, failure to diagnose a condition and creating a complication requiring transfusion or operation were discussed with the patient who freely signed the consent.   ? ?Description of Procedure: ?The patient was seen in the Preoperative Area, was examined and was deemed appropriate to proceed.  The patient was taken to [facility] endoscopy room 3, identified as Destiny Phillips and the procedure verified as Flexible Video Fiberoptic Bronchoscopy.  A Time Out was held and the above information confirmed.  ? ?Prior to the date of the procedure a high-resolution CT scan of the chest was performed. Utilizing ION software program a virtual tracheobronchial tree was generated to allow the creation of distinct navigation pathways to the patient's parenchymal abnormalities. After being taken to the operating room general anesthesia was initiated and the patient  was orally intubated. The video fiberoptic bronchoscope was introduced via the endotracheal tube and a general inspection was performed which showed some  punctate hypopigmented areas on the bilateral mainstem and lobar airways.  No discrete endobronchial lesion was seen.  No abnormal secretions.  Aspiration of the bilateral mainstems was completed to remove any remaining secretions. Robotic catheter inserted into patient's endotracheal tube.  ? ?Target #1 left lower lobe pulmonary nodule: ?The distinct navigation pathways prepared prior to this procedure were then utilized to navigate to patient's lesion identified on CT scan. The robotic catheter was secured into place and the vision probe was withdrawn.  Lesion location was approximated using fluoroscopy and radial endobronchial ultrasound for peripheral targeting.  Local registration and targeting was performed using Cios three-dimensional imaging.  Under fluoroscopic guidance transbronchial needle brushings, transbronchial needle biopsies were performed to be sent for cytology and pathology. A bronchioalveolar lavage was performed in the left lower lobe and sent for cytology. ? ?Target #2 right lower lobe groundglass nodule: ?The distinct navigation pathways prepared prior to this procedure were then utilized to navigate to patient's lesion identified on CT scan. The robotic catheter was secured into place and the vision probe was withdrawn.  Lesion location was approximated using fluoroscopy and radial endobronchial ultrasound for peripheral targeting.  Local registration and targeting were performed using Cios three-dimensional imaging.  Under fluoroscopic guidance transbronchial brushings and transbronchial forceps biopsies were performed to be sent for cytology and pathology. A bronchioalveolar lavage was performed in the right lower lobe and sent for cytology. ? ?At the end of the procedure a general airway inspection was performed and there was no evidence of active bleeding. The bronchoscope was removed.  The patient tolerated the procedure well. There was no significant blood loss and there were no  obvious complications. A post-procedural chest x-ray is pending. ? ?Samples Target #1: ?1. Transbronchial needle brushings from left lower lobe nodule ?2. Transbronchial Wang needle  biopsies from left lower lobe nodule ?3.  Bronchoalveolar lavage from the left lower lobe ? ?Samples Target #2: ?1. Transbronchial brushings from right lower lobe nodule ?2. Transbronchial forceps biopsies from right lower lobe nodule ?3. Bronchoalveolar lavage from right lower lobe ? ?Plans:  ?The patient will be discharged from the PACU to home when recovered from anesthesia and after chest x-ray is reviewed. We will review the cytology, pathology and microbiology results with the patient when they become available. Outpatient followup will be with Dr Verlee Monte.  ? ?Baltazar Apo, MD, PhD ?11/16/2021, 3:14 PM ?[facility] ?[phone] or if no answer before 7:00PM call [phone] ?For any issues after 7:00PM please call eLink [phone] ? ? ?

## 2021-11-16 NOTE — Discharge Instructions (Signed)
Flexible Bronchoscopy, Care After ?This sheet gives you information about how to care for yourself after your test. Your doctor may also give you more specific instructions. If you have problems or questions, contact your doctor. ?Follow these instructions at home: ?Eating and drinking ?Do not eat or drink anything (not even water) for 2 hours after your test, or until your numbing medicine (local anesthetic) wears off. ?When your numbness is gone and your cough and gag reflexes have come back, you may: ?Eat only soft foods. ?Slowly drink liquids. ?The day after the test, go back to your normal diet. ?Driving ?Do not drive for 24 hours if you were given a medicine to help you relax (sedative). ?Do not drive or use heavy machinery while taking prescription pain medicine. ?General instructions ? ?Take over-the-counter and prescription medicines only as told by your doctor. ?Return to your normal activities as told. Ask what activities are safe for you. ?Do not use any products that have nicotine or tobacco in them. This includes cigarettes and e-cigarettes. If you need help quitting, ask your doctor. ?Keep all follow-up visits as told by your doctor. This is important. It is very important if you had a tissue sample (biopsy) taken. ?Get help right away if: ?You have shortness of breath that gets worse. ?You get light-headed. ?You feel like you are going to pass out (faint). ?You have chest pain. ?You cough up: ?More than a little blood. ?More blood than before. ?Summary ?Do not eat or drink anything (not even water) for 2 hours after your test, or until your numbing medicine wears off. ?Do not use cigarettes. Do not use e-cigarettes. ?Get help right away if you have chest pain. ? ?Please call our office for any questions or concerns.  [phone]. ? ?This information is not intended to replace advice given to you by your health care provider. Make sure you discuss any questions you have with your health care  provider. ?Document Released: 05/02/2009 Document Revised: 06/17/2017 Document Reviewed: 07/23/2016 ?Elsevier Patient Education ? [place]. ? ?

## 2021-11-16 NOTE — Interval H&P Note (Signed)
History and Physical Interval Note: ? ?11/16/2021 ?11:46 AM ? ?Destiny Phillips  has presented today for surgery, with the diagnosis of PULMONARY NODULES.  The various methods of treatment have been discussed with the patient and family. After consideration of risks, benefits and other options for treatment, the patient has consented to  Procedure(s): ?ROBOTIC ASSISTED NAVIGATIONAL BRONCHOSCOPY (N/A) as a surgical intervention.  The patient's history has been reviewed, patient examined, no change in status, stable for surgery.  I have reviewed the patient's chart and labs.  Questions were answered to the patient's satisfaction.   ? ? ?Les Pou [name] ? ? ?

## 2021-11-16 NOTE — Telephone Encounter (Signed)
Pt had bronch today 11/16/21 .  ?

## 2021-11-17 ENCOUNTER — Telehealth: Payer: Self-pay | Admitting: Emergency Medicine

## 2021-11-17 ENCOUNTER — Encounter (HOSPITAL_COMMUNITY): Payer: Self-pay | Admitting: Emergency Medicine

## 2021-11-17 DIAGNOSIS — R0781 Pleurodynia: Secondary | ICD-10-CM

## 2021-11-17 LAB — ACID FAST SMEAR (AFB, MYCOBACTERIA): Acid Fast Smear: NEGATIVE

## 2021-11-17 NOTE — Telephone Encounter (Signed)
I called the mom back and she is having some blood that is about the size of dime that is coming up when she coughs and its more dark than bright. Its not all the time but she was conc nerd that she was still having some today.  ? ?She feels some pain on the left side that she is having pressure and sore when she is up moving. She wants to know if this is normal and how long should she expect symptoms. Please advise.  ?

## 2021-11-17 NOTE — Telephone Encounter (Signed)
The blood is about what I would expect, should tail off in a few days. I am concerned about the chest discomfort however. I think she needs a repeat CXR - is it possible for her to come in tomorrow to be seen by RB or APP with a CXR? ?

## 2021-11-17 NOTE — Telephone Encounter (Signed)
Called and spoke with patient's mom. I was able to get her scheduled tomorrow at 11am. She will have the CXR first then OV. Order has been placed.  ? ?Nothing further needed.  ?

## 2021-11-18 ENCOUNTER — Encounter: Payer: Self-pay | Admitting: Adult Health

## 2021-11-18 ENCOUNTER — Ambulatory Visit (INDEPENDENT_AMBULATORY_CARE_PROVIDER_SITE_OTHER): Payer: Managed Care, Other (non HMO)

## 2021-11-18 ENCOUNTER — Ambulatory Visit (INDEPENDENT_AMBULATORY_CARE_PROVIDER_SITE_OTHER): Payer: Managed Care, Other (non HMO) | Admitting: Adult Health

## 2021-11-18 DIAGNOSIS — R0781 Pleurodynia: Secondary | ICD-10-CM | POA: Diagnosis not present

## 2021-11-18 DIAGNOSIS — R918 Other nonspecific abnormal finding of lung field: Secondary | ICD-10-CM | POA: Diagnosis not present

## 2021-11-18 LAB — CYTOLOGY - NON PAP

## 2021-11-18 NOTE — Patient Instructions (Signed)
Activity as tolerated.  ?Follow up next week as planned and As needed   ?Please contact office for sooner follow up if symptoms do not improve or worsen or seek emergency care  ? ?

## 2021-11-18 NOTE — Assessment & Plan Note (Addendum)
Waxing and waning lung nodules first noted on CT chest December 2022.  Most recent follow-up CT chest showed a increased left lower lobe nodule measuring 14 mm.  She underwent a bronchoscopy with biopsy on Nov 16, 2021.  Cytology and cultures are pending. ?Serology with autoimmune/connective tissue labs are negative. ?Patient with some pleuritic pain.  Chest x-ray is done.  Dr. Lamonte Sakai and myself reviewed chest x-ray with no obvious pneumothorax.  Final report is pending from radiology. ?She has a follow-up next week to discuss final results ? ?Plan  ?Patient Instructions  ?Activity as tolerated.  ?Follow up next week as planned and As needed   ?Please contact office for sooner follow up if symptoms do not improve or worsen or seek emergency care  ? ?  ? ?

## 2021-11-18 NOTE — Progress Notes (Signed)
? ?@Patient  ID: Destiny HugerKatie Phillips, female    DOB: 08/11/2000, 21 y.o.   MRN: [id number] ? ?Chief Complaint  ?Patient presents with  ? Follow-up  ? ? ?Referring provider: ?Maple HudsonGilbert, Richard L Jr.,* ? ?HPI: ?21 year old female never smoker seen for pulmonary consult June 29, 2021 for abnormal CT chest with waxing and waning lung nodules ? ?TEST/EVENTS :  ? ?11/18/2021 Follow up : Lung nodules , pleuritic pain ?Patient returns for a work in visit.  Patient is being evaluated for waxing and waning pulmonary nodules ?Initially noted on CT scan December 2022.  Patient had bronchitic symptoms with ongoing cough in November December January 2022.  She was treated by primary care with a Z-Pak and then doxycycline.  This all occurred after travel from [place].  Repeat CT chest October 12, 2021 showed improvement in the groundglass nodular density.  However increased size of the left lower lobe nodule measuring 14 mm.  And some new right lower lobe groundglass nodular area.  Patient underwent bronchoscopy on May 1 by Dr. Delton CoombesByrum.  Cultures and cytology are pending ?Patient noticed that she started to have some pleuritic pain yesterday when she took in a deep breath or lifted her arms.  Patient has had some minimal blood-tinged mucus that has decreased each day.  She denies any frank hemoptysis.  No fever. ?She denies any shortness of breath. ? ? ?No Known Allergies ? ? ?There is no immunization history on file for this patient. ? ?Past Medical History:  ?Diagnosis Date  ? Idiopathic thrombocytopenic purpura (ITP) (HCC)   ? SVT (supraventricular tachycardia) (HCC)   ? ? ?Tobacco History: ?Social History  ? ?Tobacco Use  ?Smoking Status Never  ?Smokeless Tobacco Never  ? ?Counseling given: Not Answered ? ? ?Outpatient Medications Prior to Visit  ?Medication Sig Dispense Refill  ? Bacillus Coagulans-Inulin (PROBIOTIC-PREBIOTIC PO) Take 1 tablet by mouth daily. (Patient not taking: Reported on 11/18/2021)    ? ?No  facility-administered medications prior to visit.  ? ? ? ?Review of Systems:  ? ?Constitutional:   No  weight loss, night sweats,  Fevers, chills, fatigue, or  lassitude. ? ?HEENT:   No headaches,  Difficulty swallowing,  Tooth/dental problems, or  Sore throat,  ?              No sneezing, itching, ear ache, nasal congestion, post nasal drip,  ? ?CV:  No chest pain,  Orthopnea, PND, swelling in lower extremities, anasarca, dizziness, palpitations, syncope.  ? ?GI  No heartburn, indigestion, abdominal pain, nausea, vomiting, diarrhea, change in bowel habits, loss of appetite, bloody stools.  ? ?Resp:   No chest wall deformity ? ?Skin: no rash or lesions. ? ?GU: no dysuria, change in color of urine, no urgency or frequency.  No flank pain, no hematuria  ? ?MS:  No joint pain or swelling.  No decreased range of motion.  No back pain. ? ? ? ?Physical Exam ? ?BP 92/60 (BP Location: Left Arm, Cuff Size: Normal)   Pulse 80   Temp 98.5 ?F (36.9 ?C) (Temporal)   Ht 5\' 3"  (1.6 m)   Wt 119 lb (54 kg)   LMP 10/24/2021 (Exact Date)   SpO2 96%   BMI 21.08 kg/m?  ? ?GEN: A/Ox3; pleasant , NAD, well nourished  ?  ?HEENT:  [place]/AT,  NOSE-clear, THROAT-clear, no lesions, no postnasal drip or exudate noted.  ? ?NECK:  Supple w/ fair ROM; no JVD; normal carotid impulses w/o bruits; no thyromegaly or nodules  palpated; no lymphadenopathy.   ? ?RESP  Clear  P & A; w/o, wheezes/ rales/ or rhonchi. no accessory muscle use, no dullness to percussion ? ?CARD:  RRR, no m/r/g, no peripheral edema, pulses intact, no cyanosis or clubbing. ? ?GI:   Soft & nt; nml bowel sounds; no organomegaly or masses detected.  ? ?Musco: Warm bil, no deformities or joint swelling noted.  ? ?Neuro: alert, no focal deficits noted.   ? ?Skin: Warm, no lesions or rashes ? ? ? ?Lab Results: ? ?CBC ?   ?Component Value Date/Time  ? WBC 3.9 (L) 10/27/2021 1526  ? RBC 4.79 10/27/2021 1526  ? HGB 12.0 10/27/2021 1526  ? HGB 11.7 11/26/2020 1407  ? HCT 36.8  10/27/2021 1526  ? HCT 36.5 11/26/2020 1407  ? PLT 139.0 (L) 10/27/2021 1526  ? PLT 137 (L) 11/26/2020 1407  ? MCV 76.8 (L) 10/27/2021 1526  ? MCV 78 (L) 11/26/2020 1407  ? MCH 25.4 (L) 12/11/2020 1904  ? MCHC 32.6 10/27/2021 1526  ? RDW 13.9 10/27/2021 1526  ? RDW 14.4 11/26/2020 1407  ? LYMPHSABS 1.6 10/27/2021 1526  ? LYMPHSABS 1.5 11/26/2020 1407  ? MONOABS 0.3 10/27/2021 1526  ? EOSABS 0.1 10/27/2021 1526  ? EOSABS 0.1 11/26/2020 1407  ? BASOSABS 0.0 10/27/2021 1526  ? BASOSABS 0.0 11/26/2020 1407  ? ? ?BMET ?   ?Component Value Date/Time  ? NA 134 (L) 12/11/2020 1904  ? K 3.8 12/11/2020 1904  ? CL 99 12/11/2020 1904  ? CO2 28 12/11/2020 1904  ? GLUCOSE 90 12/11/2020 1904  ? BUN 15 12/11/2020 1904  ? CREATININE 0.75 12/11/2020 1904  ? CALCIUM 9.4 12/11/2020 1904  ? GFRNONAA >60 12/11/2020 1904  ? GFRAA NOT CALCULATED 11/25/2012 0211  ? ? ?BNP ?No results found for: BNP ? ?ProBNP ?No results found for: PROBNP ? ?Imaging: ?DG Chest Port 1 View ? ?Result Date: 11/16/2021 ?CLINICAL DATA:  Status post bronchoscopy EXAM: PORTABLE CHEST 1 VIEW COMPARISON:  06/10/2021, CT 11/11/2021 FINDINGS: The left lung is grossly clear. The previously noted pulmonary nodules by CT are not well seen radiographically. Mild hazy right lower lung opacity. Normal cardiac size. No pneumothorax IMPRESSION: Possible mild hazy right lower lung opacity potentially related to recent bronchoscopy. There is no pneumothorax or other acute abnormality. Electronically Signed   By: Jasmine Pang M.D.   On: 11/16/2021 15:41  ? ?DG C-Arm 1-60 Min-No Report ? ?Result Date: 11/16/2021 ?Fluoroscopy was utilized by the requesting physician.  No radiographic interpretation.  ? ?CT Super D Chest Wo Contrast ? ?Result Date: 11/11/2021 ?CLINICAL DATA:  Lung nodules, preprocedural for bronchoscopy EXAM: CT CHEST WITHOUT CONTRAST TECHNIQUE: Multidetector CT imaging of the chest was performed using thin slice collimation for electromagnetic bronchoscopy planning  purposes, without intravenous contrast. RADIATION DOSE REDUCTION: This exam was performed according to the departmental dose-optimization program which includes automated exposure control, adjustment of the mA and/or kV according to patient size and/or use of iterative reconstruction technique. COMPARISON:  10/12/2021 FINDINGS: Cardiovascular: Unremarkable Mediastinum/Nodes: Unremarkable Lungs/Pleura: A dominant left lower lobe nodule measures 1.4 by 1.1 cm on image 109 of series 3, previously 1.4 by 1.0 cm on 10/12/2021. Bilateral scattered ground-glass density nodules are present in both lungs. Some are new, some have resolved, and some demonstrate increase or decrease in prominence. For example, the 0.8 by 0.6 cm ground-glass density in the right lower lobe on image 85 of series 3 is new compared to 10/12/2021. A ground-glass density nodule previously  shown peripherally in the anterior right upper lobe on image 57 of series 3 of the prior exam has resolved. The 1.2 by 1.5 cm ground-glass density nodule anteriorly in the right lower lobe on image 79 of series 3 is increased in solidity and previously measured 1.0 by 0.9 cm. Multiple additional ground-glass density nodules are present. Upper Abdomen: Prominent upper margin of the spleen raising suspicion for potential splenomegaly. Musculoskeletal: Unremarkable IMPRESSION: 1. Stable dominant solid nodule in the left lower lobe measuring 1.4 by 1.0 cm on image 109 series 3. 2. Waxing and waning ground-glass density nodules in both lungs, including resolution of some prior nodules, some new nodules, some nodules remaining stable, and some nodules demonstrating increase or decrease in size/prominence. This appearance of the ground-glass density nodules would tend to favor a inflammatory or infectious process particularly in light of the patient's age. 3. Prominent upper margin of the spleen raising suspicion for potential splenomegaly. Electronically Signed   By:  Gaylyn Rong M.D.   On: 11/11/2021 13:52  ? ?DG C-ARM BRONCHOSCOPY ? ?Result Date: 11/16/2021 ?C-ARM BRONCHOSCOPY: Fluoroscopy was utilized by the requesting physician.  No radiographic interpretation.   ? ? ? ? ?  Latest Ref Rng & Units

## 2021-11-20 ENCOUNTER — Telehealth: Payer: Self-pay | Admitting: Student

## 2021-11-20 DIAGNOSIS — R918 Other nonspecific abnormal finding of lung field: Secondary | ICD-10-CM

## 2021-11-20 NOTE — Telephone Encounter (Signed)
Called and discussed results of bronch. No cancer. She's feeling better - less pleuritic pain, hemoptysis. All results unrevealing save for one target in LLL with granulomatous inflammation - unclear etiology. We will check some fungal serologies, Ig levels, IgG4. Fungal cx, AFB cx pending. ? ?Destiny Phillips  ?[facility] ? ?

## 2021-11-21 LAB — AEROBIC/ANAEROBIC CULTURE W GRAM STAIN (SURGICAL/DEEP WOUND)
Culture: NORMAL
Culture: NO GROWTH
Gram Stain: NONE SEEN
Gram Stain: NONE SEEN

## 2021-11-24 LAB — ACID FAST SMEAR (AFB, MYCOBACTERIA): Acid Fast Smear: NEGATIVE

## 2021-11-24 NOTE — Progress Notes (Signed)
? ?Synopsis: Referred for walking pneumonia by Maple HudsonGilbert, Richard L Jr.,* ? ?Subjective:  ? ?PATIENT ID: Destiny HugerKatie Phillips GENDER: female DOB: 12/29/2000, MRN: [id number] ? ?Chief Complaint  ?Patient presents with  ? Follow-up  ?  Breathing is overall doing well. She still has occ cough with yellow sputum.   ? ?21yF with history of SVT, ITP who is referred for walking pneumonia, chronic cough. ? ?She says that she's had a cough for about a month. Got prednisone and z-pack and then doxy. Cough has improved and chest congestion has improved as well.  ? ?She has some pleuritic left sided CP over the last couple of weeks. Not coughing up any blood. She has no dyspnea. Started symbicort a couple of weeks ago. PCP thinks she may have developed asthma. Rinses mouth after using symbicort. She has never had a blood clot. Not taking any birth control pills.  ? ?She was tested for flu and covid, negative. She wasn't vaccinated for covid. ? ?Never had asthma as a kid that she was aware of but did have inhaler.  ? ?Last time she needed steroids for ITP was years ago.  ? ?No family history of lung disease. ? ?She goes to school online. 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 Not a ton of metal dust exposure there. She has never lived outside of [place], no recent travel. No pets at home ? ?Interval HPI: ?Underwent robotic bronch on 5/1  ? ?She still does have cough productive of yellowish mucus most days. She doesn't recall effect of steroids last fall. Hasn't run yet since bronch but she thinks she'll be ok from DOE standpoint. No CP, flank pain, trouble eating.  ? ?No rashes. Does have some knee pain but no morning stiffness. Oral/nasal ulcers, trouble swallowing. No raynaud's phenomenon.  ? ?Otherwise pertinent review of systems is negative. ? ?Past Medical History:  ?Diagnosis Date  ? Idiopathic thrombocytopenic purpura (ITP) (HCC)   ? SVT  (supraventricular tachycardia) (HCC)   ?  ? ?Family History  ?Problem Relation Age of Onset  ? Healthy Mother   ? Healthy Father   ? Cancer Paternal Uncle   ? Breast cancer Maternal Grandmother   ? Colon cancer Paternal Grandfather   ?  ? ?Past Surgical History:  ?Procedure Laterality Date  ? BRONCHIAL BIOPSY  11/16/2021  ? Procedure: BRONCHIAL BIOPSIES;  Surgeon: Leslye PeerByrum, Robert S, MD;  [facility];  Service: Pulmonary;;  ? BRONCHIAL BRUSHINGS  11/16/2021  ? Procedure: BRONCHIAL BRUSHINGS;  Surgeon: Leslye PeerByrum, Robert S, MD;  [facility];  Service: Pulmonary;;  ? BRONCHIAL NEEDLE ASPIRATION BIOPSY  11/16/2021  ? Procedure: BRONCHIAL NEEDLE ASPIRATION BIOPSIES;  Surgeon: Leslye PeerByrum, Robert S, MD;  [facility];  Service: Pulmonary;;  ? BRONCHIAL WASHINGS  11/16/2021  ? Procedure: BRONCHIAL WASHINGS;  Surgeon: Leslye PeerByrum, Robert S, MD;  [facility];  Service: Pulmonary;;  ? CARDIAC ELECTROPHYSIOLOGY STUDY AND ABLATION    ? at [facility]  ? VIDEO BRONCHOSCOPY WITH RADIAL ENDOBRONCHIAL ULTRASOUND  11/16/2021  ? Procedure: VIDEO BRONCHOSCOPY WITH RADIAL ENDOBRONCHIAL ULTRASOUND;  Surgeon: Leslye PeerByrum, Robert S, MD;  [facility];  Service: Pulmonary;;  ? ? ?Social History  ? ?Socioeconomic History  ? Marital status: Single  ?  Spouse name: Not on file  ? Number of children: Not on file  ? Years of education: Not on file  ? Highest education level: Not  on file  ?Occupational History  ? Not on file  ?Tobacco Use  ? Smoking status: Never  ? Smokeless tobacco: Never  ?Substance and Sexual Activity  ? Alcohol use: Never  ? Drug use: Never  ? Sexual activity: Not on file  ?Other Topics Concern  ? Not on file  ?Social History Narrative  ? Not on file  ? ?Social Determinants of Health  ? ?Financial Resource Strain: Not on file  ?Food Insecurity: Not on file  ?Transportation Needs: Not on file  ?Physical Activity: Not on file  ?Stress: Not on file  ?Social Connections: Not on file  ?Intimate Partner Violence: Not  on file  ?  ? ?No Known Allergies  ? ?Outpatient Medications Prior to Visit  ?Medication Sig Dispense Refill  ? Bacillus Coagulans-Inulin (PROBIOTIC-PREBIOTIC PO) Take 1 tablet by mouth daily.    ? ?No facility-administered medications prior to visit.  ? ? ? ? ? ?Objective:  ? ?Physical Exam: ? ?General appearance: 21 y.o., female, NAD, conversant  ?Eyes: anicteric sclerae; PERRL, tracking appropriately ?Neck: Trachea midline; no lymphadenopathy, no JVD ?Lungs: CTAB, no crackles, no wheeze, with normal respiratory effort ?CV: RRR, no murmur  ?Abdomen: Soft, non-tender; non-distended, BS present  ?Extremities: No peripheral edema, warm ?Skin: Normal turgor and texture; no rash ?Psych: Appropriate affect ?Neuro: Alert and oriented to person and place, no focal deficit  ? ? ? ?Vitals:  ? 11/26/21 0915  ?BP: 110/70  ?Pulse: 99  ?Temp: 97.6 ?F (36.4 ?C)  ?TempSrc: Oral  ?SpO2: 100%  ?Weight: 116 lb (52.6 kg)  ?Height: 5\' 3"  (1.6 m)  ? ? ? ? ?100% on RA ?BMI Readings from Last 3 Encounters:  ?11/26/21 20.55 kg/m?  ?11/18/21 21.08 kg/m?  ?11/16/21 20.73 kg/m?  ? ?Wt Readings from Last 3 Encounters:  ?11/26/21 116 lb (52.6 kg)  ?11/18/21 119 lb (54 kg)  ?11/16/21 117 lb (53.1 kg)  ? ? ? ?CBC ?   ?Component Value Date/Time  ? WBC 3.9 (L) 10/27/2021 1526  ? RBC 4.79 10/27/2021 1526  ? HGB 12.0 10/27/2021 1526  ? HGB 11.7 11/26/2020 1407  ? HCT 36.8 10/27/2021 1526  ? HCT 36.5 11/26/2020 1407  ? PLT 139.0 (L) 10/27/2021 1526  ? PLT 137 (L) 11/26/2020 1407  ? MCV 76.8 (L) 10/27/2021 1526  ? MCV 78 (L) 11/26/2020 1407  ? MCH 25.4 (L) 12/11/2020 1904  ? MCHC 32.6 10/27/2021 1526  ? RDW 13.9 10/27/2021 1526  ? RDW 14.4 11/26/2020 1407  ? LYMPHSABS 1.6 10/27/2021 1526  ? LYMPHSABS 1.5 11/26/2020 1407  ? MONOABS 0.3 10/27/2021 1526  ? EOSABS 0.1 10/27/2021 1526  ? EOSABS 0.1 11/26/2020 1407  ? BASOSABS 0.0 10/27/2021 1526  ? BASOSABS 0.0 11/26/2020 1407  ? ?11/16/21 Cytology LLL target 2 with granulomatous inflammation ? ?Eos  unremarkable ? ?RF, CCP, ANCAs, ENA panel all negative  ? ?Chest Imaging: ? ?CXR 06/10/21 reviewed by me unremarkable ? ?CXR 12/11/20 reviewed by me unremarkable ? ?CT Chest 12/27 reviewed by me remarkable for several nodular opacities largest 34mm some with surrounding ggo ? ?CT Chest 11/11/21 reviewed by me with waxing and waning ggos, stable dominant LLL nodule  ? ? ?Pulmonary Functions Testing Results: ? ?  Latest Ref Rng & Units 08/18/2021  ? 10:49 AM  ?PFT Results  ?FVC-Pre L 3.20    ?FVC-Predicted Pre % 87    ?FVC-Post L 3.34    ?FVC-Predicted Post % 91    ?Pre FEV1/FVC % % 83    ?  Post FEV1/FCV % % 86    ?FEV1-Pre L 2.65    ?FEV1-Predicted Pre % 82    ?FEV1-Post L 2.86    ?DLCO uncorrected ml/min/mmHg 19.54    ?DLCO UNC% % 91    ?DLCO corrected ml/min/mmHg 19.54    ?DLCO COR %Predicted % 91    ?DLVA Predicted % 96    ?TLC L 4.34    ?TLC % Predicted % 88    ?RV % Predicted % 112    ? ?Reviewed by me with borderline elevated RV/TLC suggestive of air trapping but normal FVC/SVC ? ?   ?Assessment & Plan:  ? ?# Pulmonary nodules ?# Subacute cough ?Overall agree that waxing/waning nature of nodules/ggo favors infectious/inflammatory process and now s/p bronch/bx with path of largest nodule with evidence of only granulomatous inflammation. Unclear etiology. Considerations include a limited form of alveolar sarcoid, endemic fungal infection, NTM, IgG4 RD, LIP (can occasionally have granuloma formation) but she has no cysts, GLILD. ILD has been reported in association with ITP however not typically with OP pattern. Never smoker. ? ?# History of covid-19 infection ? ?Plan: ?- f/u fungal serologies, Ig levels, IgG4, ACE level  ?- if above unrevealing then may need to entertain referral for second opinion ?- declines empiric trial of steroids with repeat surveillance imaging at this time which I think is reasonable ? ? ?RTC 6 weeks video visit ? ?Omar Person, MD ?[facility] ?11/26/2021 9:23 AM  ? ?

## 2021-11-26 ENCOUNTER — Encounter: Payer: Self-pay | Admitting: Student

## 2021-11-26 ENCOUNTER — Ambulatory Visit (INDEPENDENT_AMBULATORY_CARE_PROVIDER_SITE_OTHER): Payer: Managed Care, Other (non HMO) | Admitting: Student

## 2021-11-26 VITALS — BP 110/70 | HR 99 | Temp 97.6°F | Ht 63.0 in | Wt 116.0 lb

## 2021-11-26 DIAGNOSIS — R918 Other nonspecific abnormal finding of lung field: Secondary | ICD-10-CM | POA: Diagnosis not present

## 2021-11-26 DIAGNOSIS — R053 Chronic cough: Secondary | ICD-10-CM

## 2021-11-26 NOTE — Patient Instructions (Addendum)
-   Fungal serologies, autoimmune tests - if you have any questions about the results before next visit then feel free to reach out with my chart message or call ?- I'll see you with video visit in 6-8 weeks ?

## 2021-12-02 LAB — MVISTA BLASTOMYCES QNT AG, URINE
Interpretation: NEGATIVE
Result (ng/ml): NOT DETECTED ng/mL

## 2021-12-02 LAB — IGG, IGA, IGM
IgG (Immunoglobin G), Serum: 247 mg/dL — ABNORMAL LOW (ref 600–1640)
IgM, Serum: 15 mg/dL — ABNORMAL LOW (ref 50–300)
Immunoglobulin A: 6 mg/dL — ABNORMAL LOW (ref 47–310)

## 2021-12-02 LAB — ANGIOTENSIN CONVERTING ENZYME: Angiotensin-Converting Enzyme: 46 U/L (ref 9–67)

## 2021-12-02 LAB — COCCIDIOIDES AB(IGG,IGM)IMMUNODIFFUSION W/RFX TO COMPLEMENT FIXATION
AB to F Antigen(IgG),ID: NEGATIVE
AB to TP Antigen(IgM),ID: NEGATIVE

## 2021-12-02 LAB — HISTOPLASMA ANTIGEN, URINE

## 2021-12-03 LAB — HISTOPLASMA GAL'MANNAN AG SER: Histoplasma Gal'mannan Ag Ser: <0.5 (ref <0.5)

## 2021-12-03 LAB — FILARIA ANTIBODY (IGG4): Filaria Antibody (IgG4): 1.03 INDEX

## 2021-12-03 LAB — CRYPTOCOCCAL ANTIGEN: Cryptococcus Antigen, Serum: NEGATIVE

## 2021-12-03 LAB — BLASTOMYCES ANTIGEN: Blastomyces Antigen: NOT DETECTED ng/mL

## 2021-12-03 LAB — ASPERGILLUS FUMIGATUS IGG: Aspergillus fumigatus IgG: <2 mg/L (ref 0.0–81.5)

## 2021-12-07 ENCOUNTER — Telehealth: Payer: Self-pay | Admitting: Student

## 2021-12-07 NOTE — Telephone Encounter (Signed)
All the bloodwork we did at last lab visit is back. 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 It is also sometimes associated with autoimmune cytopenias (perhaps the ITP is related) and granulomatous inflammation in the lung (although the appearance your CT Chest has is not the classic form). I'd like to refer you to an immunologist so they can test your vaccine responsiveness, confirm the diagnosis and consider treatment with IVIG with or without a course of steroids. Not sure if this would best be done [facility] since [facility]'s [facility] you [facility] hematology in the past or if instead you'd prefer to see someone in [place]. Let me know what you prefer and I'll make the referral.

## 2021-12-18 LAB — FUNGUS CULTURE RESULT

## 2021-12-18 LAB — FUNGUS CULTURE WITH STAIN

## 2021-12-18 LAB — FUNGAL ORGANISM REFLEX

## 2021-12-31 LAB — ACID FAST CULTURE WITH REFLEXED SENSITIVITIES (MYCOBACTERIA): Acid Fast Culture: NEGATIVE

## 2022-01-06 ENCOUNTER — Telehealth: Payer: Self-pay | Admitting: Student

## 2022-01-06 NOTE — Telephone Encounter (Signed)
Nope! Oversight on my part

## 2022-01-06 NOTE — Telephone Encounter (Signed)
Dr Thora Lance - you routed to the [facility] Pool yesterday evening.  Is there something I need to schedule for you?

## 2022-01-06 NOTE — Telephone Encounter (Signed)
LMTCB for the pt  No need to keep appt with Dr Thora Lance at 9:15 6/22 unless symptoms are worse or wants to discuss any new issues. He sent her the following mychart msg regarding her labs done here:  Omar Person, MD to [facility]       12/07/21 11:22 AM All the bloodwork we did at last lab visit is back. 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 It is also sometimes associated with autoimmune cytopenias (perhaps the ITP is related) and granulomatous inflammation in the lung (although the appearance your CT Chest has is not the classic form). I'd like to refer you to an immunologist so they can test your vaccine responsiveness, confirm the diagnosis and consider treatment with IVIG with or without a course of steroids. Not sure if this would best be done with [facility] since [facility]'s where you saw hematology in the past or if instead you'd prefer to see someone in [place]. Let me know what you prefer and I'll make the referral.  This MyChart message has not been read.  The referral has already been made.

## 2022-01-07 ENCOUNTER — Ambulatory Visit: Payer: Managed Care, Other (non HMO) | Admitting: Student

## 2022-01-07 LAB — ACID FAST CULTURE WITH REFLEXED SENSITIVITIES (MYCOBACTERIA): Acid Fast Culture: NEGATIVE

## 2022-02-18 ENCOUNTER — Ambulatory Visit
Admission: RE | Admit: 2022-02-18 | Discharge: 2022-02-18 | Disposition: A | Discharge status: Home / Self Care | Payer: Managed Care, Other (non HMO) | Source: Ambulatory Visit | Attending: Family Medicine | Admitting: Family Medicine

## 2022-02-18 ENCOUNTER — Encounter: Payer: Self-pay | Admitting: Family Medicine

## 2022-02-18 ENCOUNTER — Ambulatory Visit
Admission: RE | Admit: 2022-02-18 | Discharge: 2022-02-18 | Disposition: A | Discharge status: Home / Self Care | Payer: Managed Care, Other (non HMO) | Attending: Family Medicine | Admitting: Family Medicine

## 2022-02-18 ENCOUNTER — Ambulatory Visit (INDEPENDENT_AMBULATORY_CARE_PROVIDER_SITE_OTHER): Payer: Managed Care, Other (non HMO) | Admitting: Family Medicine

## 2022-02-18 VITALS — BP 117/77 | HR 120 | Temp 99.5°F | Resp 16 | Ht 63.0 in | Wt 113.7 lb

## 2022-02-18 DIAGNOSIS — R509 Fever, unspecified: Secondary | ICD-10-CM | POA: Insufficient documentation

## 2022-02-18 DIAGNOSIS — R52 Pain, unspecified: Secondary | ICD-10-CM | POA: Diagnosis present

## 2022-02-18 DIAGNOSIS — R0989 Other specified symptoms and signs involving the circulatory and respiratory systems: Secondary | ICD-10-CM | POA: Diagnosis present

## 2022-02-18 LAB — POCT INFLUENZA A/B
Influenza A, POC: NEGATIVE
Influenza B, POC: NEGATIVE

## 2022-02-18 NOTE — Progress Notes (Signed)
Established patient visit  I,Destiny Phillips,acting as a scribe for Megan Mans, MD.,have documented all relevant documentation on the behalf of Megan Mans, MD,as directed by  Megan Mans, MD while in the presence of Megan Mans, MD.   Patient: Destiny Phillips   DOB: 06/02/2001   21 y.o. Female  MRN: [id number] Visit Date: 02/18/2022  Today's healthcare provider: Megan Mans, MD   Chief Complaint  Patient presents with   Generalized Body Aches   Subjective    HPI  Patient is a 21 year old with concern of body aches, fever of 101.4 this AM,very nauseous,headache, nasal congestion and cough. Symptoms started Tuesday night. No vomiting, diarrhea, chest pain, sob. Took Nyquil last night.  Medications: Outpatient Medications Prior to Visit  Medication Sig   Bacillus Coagulans-Inulin (PROBIOTIC-PREBIOTIC PO) Take 1 tablet by mouth daily.   No facility-administered medications prior to visit.    Review of Systems  Constitutional:  Positive for chills and fever.  HENT:  Positive for congestion. Negative for ear pain, sinus pressure, sinus pain, sneezing and sore throat.   Respiratory:  Positive for cough. Negative for chest tightness and shortness of breath.   Cardiovascular:  Negative for chest pain and palpitations.  Gastrointestinal:  Positive for nausea. Negative for diarrhea.  Neurological:  Positive for headaches.        Objective    BP 117/77 (BP Location: Right Arm, Patient Position: Sitting, Cuff Size: Normal)   Pulse (!) 120   Temp 99.5 F (37.5 C) (Oral)   Resp 16   Ht 5\' 3"  (1.6 m)   Wt 113 lb 11.2 oz (51.6 kg)   SpO2 100%   BMI 20.14 kg/m  BP Readings from Last 3 Encounters:  02/18/22 117/77  11/26/21 110/70  11/18/21 92/60   Wt Readings from Last 3 Encounters:  02/18/22 113 lb 11.2 oz (51.6 kg)  11/26/21 116 lb (52.6 kg)  11/18/21 119 lb (54 kg)      Physical Exam Vitals reviewed.  Constitutional:      General:  She is not in acute distress.    Appearance: She is well-developed.  HENT:     Head: Normocephalic and atraumatic.     Right Ear: Hearing normal.     Left Ear: Hearing normal.     Nose: Nose normal.  Eyes:     General: Lids are normal. No scleral icterus.       Right eye: No discharge.        Left eye: No discharge.     Conjunctiva/sclera: Conjunctivae normal.  Cardiovascular:     Rate and Rhythm: Normal rate and regular rhythm.     Heart sounds: Normal heart sounds.  Pulmonary:     Effort: Pulmonary effort is normal. No respiratory distress.  Abdominal:     Palpations: Abdomen is soft.  Lymphadenopathy:     Cervical: No cervical adenopathy.  Skin:    General: Skin is warm and dry.     Findings: No lesion or rash.  Neurological:     General: No focal deficit present.     Mental Status: She is alert and oriented to person, place, and time.  Psychiatric:        Mood and Affect: Mood normal.        Speech: Speech normal.        Behavior: Behavior normal.        Thought Content: Thought content normal.  Judgment: Judgment normal.       No results found for any visits on 02/18/22.  Assessment & Plan     1. Fever, unspecified fever cause Almost certain that this is a viral illness. - CBC w/Diff/Platelet - Comprehensive Metabolic Panel (CMET) - POCT Influenza A/B--Negative  2. Generalized body aches Negative home COVID test and negative flu swab in the office today. Obtain chest x-ray because of history of Monia - CBC w/Diff/Platelet - Comprehensive Metabolic Panel (CMET) - POCT Influenza A/B--Negative   No follow-ups on file.      I, Megan Mans, MD, have reviewed all documentation for this visit. The documentation on 02/22/22 for the exam, diagnosis, procedures, and orders are all accurate and complete.    Ketsia Linebaugh Wendelyn Breslow, MD  [facility] [phone] (phone) [phone] (fax)  [facility]

## 2022-02-19 LAB — CBC WITH DIFFERENTIAL/PLATELET
Basophils Absolute: 0 10*3/uL (ref 0.0–0.2)
Basos: 0 %
EOS (ABSOLUTE): 0 10*3/uL (ref 0.0–0.4)
Eos: 0 %
Hematocrit: 37.4 % (ref 34.0–46.6)
Hemoglobin: 11.7 g/dL (ref 11.1–15.9)
Immature Grans (Abs): 0 10*3/uL (ref 0.0–0.1)
Immature Granulocytes: 1 %
Lymphocytes Absolute: 1.1 10*3/uL (ref 0.7–3.1)
Lymphs: 13 %
MCH: 24.9 pg — ABNORMAL LOW (ref 26.6–33.0)
MCHC: 31.3 g/dL — ABNORMAL LOW (ref 31.5–35.7)
MCV: 80 fL (ref 79–97)
Monocytes Absolute: 0.6 10*3/uL (ref 0.1–0.9)
Monocytes: 7 %
Neutrophils Absolute: 6.6 10*3/uL (ref 1.4–7.0)
Neutrophils: 79 %
Platelets: 113 10*3/uL — ABNORMAL LOW (ref 150–450)
RBC: 4.69 x10E6/uL (ref 3.77–5.28)
RDW: 13.6 % (ref 11.7–15.4)
WBC: 8.4 10*3/uL (ref 3.4–10.8)

## 2022-02-19 LAB — COMPREHENSIVE METABOLIC PANEL
ALT: 11 IU/L (ref 0–32)
AST: 13 IU/L (ref 0–40)
Albumin/Globulin Ratio: 2.6 — ABNORMAL HIGH (ref 1.2–2.2)
Albumin: 4.6 g/dL (ref 4.0–5.0)
Alkaline Phosphatase: 106 IU/L (ref 44–121)
BUN/Creatinine Ratio: 10 (ref 9–23)
BUN: 7 mg/dL (ref 6–20)
Bilirubin Total: 0.3 mg/dL (ref 0.0–1.2)
CO2: 18 mmol/L — ABNORMAL LOW (ref 20–29)
Calcium: 9.6 mg/dL (ref 8.7–10.2)
Chloride: 100 mmol/L (ref 96–106)
Creatinine, Ser: 0.7 mg/dL (ref 0.57–1.00)
Globulin, Total: 1.8 g/dL (ref 1.5–4.5)
Glucose: 146 mg/dL — ABNORMAL HIGH (ref 70–99)
Potassium: 4.4 mmol/L (ref 3.5–5.2)
Sodium: 139 mmol/L (ref 134–144)
Total Protein: 6.4 g/dL (ref 6.0–8.5)
eGFR: 125 mL/min/{1.73_m2} (ref >59)

## 2022-03-02 ENCOUNTER — Other Ambulatory Visit: Payer: Self-pay | Admitting: Family Medicine

## 2022-03-02 DIAGNOSIS — R52 Pain, unspecified: Secondary | ICD-10-CM

## 2022-03-02 DIAGNOSIS — R053 Chronic cough: Secondary | ICD-10-CM

## 2022-03-02 DIAGNOSIS — R509 Fever, unspecified: Secondary | ICD-10-CM

## 2022-03-02 MED ORDER — DOXYCYCLINE HYCLATE 100 MG PO TABS: 100.0000 mg | ORAL_TABLET | Freq: Two times a day (BID) | ORAL | 0 refills | Status: DC

## 2022-03-06 LAB — UA/M W/RFLX CULTURE, ROUTINE
Bilirubin, UA: NEGATIVE
Glucose, UA: NEGATIVE
Nitrite, UA: POSITIVE — AB
Specific Gravity, UA: 1.024 (ref 1.005–1.030)
Urobilinogen, Ur: 0.2 mg/dL (ref 0.2–1.0)
pH, UA: 5.5 (ref 5.0–7.5)

## 2022-03-06 LAB — CBC WITH DIFFERENTIAL/PLATELET
Basophils Absolute: 0 10*3/uL (ref 0.0–0.2)
Basos: 0 %
EOS (ABSOLUTE): 0 10*3/uL (ref 0.0–0.4)
Eos: 0 %
Hematocrit: 31.4 % — ABNORMAL LOW (ref 34.0–46.6)
Hemoglobin: 10.1 g/dL — ABNORMAL LOW (ref 11.1–15.9)
Immature Grans (Abs): 0.1 10*3/uL (ref 0.0–0.1)
Immature Granulocytes: 1 %
Lymphocytes Absolute: 1.1 10*3/uL (ref 0.7–3.1)
Lymphs: 12 %
MCH: 24.3 pg — ABNORMAL LOW (ref 26.6–33.0)
MCHC: 32.2 g/dL (ref 31.5–35.7)
MCV: 76 fL — ABNORMAL LOW (ref 79–97)
Monocytes Absolute: 0.8 10*3/uL (ref 0.1–0.9)
Monocytes: 9 %
Neutrophils Absolute: 7.2 10*3/uL — ABNORMAL HIGH (ref 1.4–7.0)
Neutrophils: 78 %
Platelets: 178 10*3/uL (ref 150–450)
RBC: 4.16 x10E6/uL (ref 3.77–5.28)
RDW: 13.4 % (ref 11.7–15.4)
WBC: 9.1 10*3/uL (ref 3.4–10.8)

## 2022-03-06 LAB — MICROSCOPIC EXAMINATION
Casts: NONE SEEN /lpf
Epithelial Cells (non renal): >10 /hpf — AB (ref 0–10)
WBC, UA: >30 /hpf — AB (ref 0–5)

## 2022-03-06 LAB — LYME DISEASE SEROLOGY W/REFLEX: Lyme Total Antibody EIA: NEGATIVE

## 2022-03-06 LAB — COMPREHENSIVE METABOLIC PANEL
ALT: 9 IU/L (ref 0–32)
AST: 10 IU/L (ref 0–40)
Albumin/Globulin Ratio: 2 (ref 1.2–2.2)
Albumin: 4.2 g/dL (ref 4.0–5.0)
Alkaline Phosphatase: 103 IU/L (ref 44–121)
BUN/Creatinine Ratio: 9 (ref 9–23)
BUN: 8 mg/dL (ref 6–20)
Bilirubin Total: 0.5 mg/dL (ref 0.0–1.2)
CO2: 21 mmol/L (ref 20–29)
Calcium: 9.2 mg/dL (ref 8.7–10.2)
Chloride: 98 mmol/L (ref 96–106)
Creatinine, Ser: 0.94 mg/dL (ref 0.57–1.00)
Globulin, Total: 2.1 g/dL (ref 1.5–4.5)
Glucose: 162 mg/dL — ABNORMAL HIGH (ref 70–99)
Potassium: 4.5 mmol/L (ref 3.5–5.2)
Sodium: 136 mmol/L (ref 134–144)
Total Protein: 6.3 g/dL (ref 6.0–8.5)
eGFR: 88 mL/min/{1.73_m2} (ref >59)

## 2022-03-06 LAB — ROCKY MTN SPOTTED FVR ABS PNL(IGG+IGM)
RMSF IgG: NEGATIVE
RMSF IgM: 0.17 index (ref 0.00–0.89)

## 2022-03-06 LAB — URINE CULTURE, REFLEX

## 2022-05-07 ENCOUNTER — Telehealth: Payer: Self-pay

## 2022-05-07 NOTE — Telephone Encounter (Signed)
Lmtcb to schedule appt.

## 2022-05-07 NOTE — Telephone Encounter (Signed)
Copied from [place] [phone]. Topic: General - Other >> May 07, 2022 10:04 AM [place] B wrote: Reason for CRM: Patient called in states [facility] hospital ER, wants her to have mri done on her head. Can orders be put in or does she need an appt? Please call back

## 2022-05-07 NOTE — Telephone Encounter (Signed)
Would need to be seen. We don't order advanced imaging without an eval. Can re-establish with Quentin Cornwall or one of the APPs

## 2022-05-11 ENCOUNTER — Ambulatory Visit (INDEPENDENT_AMBULATORY_CARE_PROVIDER_SITE_OTHER): Payer: Managed Care, Other (non HMO) | Admitting: Family Medicine

## 2022-05-11 ENCOUNTER — Encounter: Payer: Self-pay | Admitting: Family Medicine

## 2022-05-11 VITALS — BP 115/77 | HR 90 | Resp 16 | Ht 63.0 in | Wt 116.0 lb

## 2022-05-11 DIAGNOSIS — G43409 Hemiplegic migraine, not intractable, without status migrainosus: Secondary | ICD-10-CM | POA: Diagnosis not present

## 2022-05-11 DIAGNOSIS — Z23 Encounter for immunization: Secondary | ICD-10-CM

## 2022-05-11 MED ORDER — VERAPAMIL HCL ER 120 MG PO TBCR: 120.0000 mg | EXTENDED_RELEASE_TABLET | Freq: Every day | ORAL | 0 refills | Status: DC | PRN

## 2022-05-11 NOTE — Progress Notes (Unsigned)
I,[name],acting as a scribe for [employer], MD.,have documented all relevant documentation on the behalf of [name], MD,as directed by  [name], MD while in the presence of [name], MD.   Established patient visit   Patient: Destiny Phillips   DOB: [birth year]   21 y.o. Female  MRN: [id number] Visit Date: 05/11/2022  Today's healthcare provider: [name], MD   Chief Complaint  Patient presents with   Loss of Vision   Subjective    HPI   Vision Loss ER follow-up She was recently treated for complicated migraine in the ED on 10/19 and 10/21 She reports No HA was present at time of vision loss. Reports that her symptoms were sudden and HA came after once symptoms had subsided. Reports that she has been wearing her glasses and hasn't had any episodes so far. Head CT completed and showed no acute infarcts  Patient states that she has completely returned to baseline She states that she may not have been eating or drinking normally at the time of these events Patient denies any disturbance in her gait or sense of balance She has no pain or numbness or paresthesias today     Medications: Outpatient Medications Prior to Visit  Medication Sig   [DISCONTINUED] Bacillus Coagulans-Inulin (PROBIOTIC-PREBIOTIC PO) Take 1 tablet by mouth daily.   [DISCONTINUED] doxycycline (VIBRA-TABS) 100 MG tablet Take 1 tablet (100 mg total) by mouth 2 (two) times daily.   No facility-administered medications prior to visit.    Review of Systems     Objective    BP 115/77 (BP Location: Right Arm, Patient Position: Sitting, Cuff Size: Normal)   Pulse 90   Resp 16   Ht 5\' 3"  (1.6 m)   Wt 116 lb (52.6 kg)   BMI 20.55 kg/m    Physical Exam Vitals reviewed.  Constitutional:      General: She is not in acute distress.    Appearance: Normal appearance. She is not ill-appearing, toxic-appearing or diaphoretic.   Eyes:     Conjunctiva/sclera: Conjunctivae normal.  Cardiovascular:     Rate and Rhythm: Normal rate and regular rhythm.     Pulses: Normal pulses.     Heart sounds: Normal heart sounds. No murmur heard.    No friction rub. No gallop.  Pulmonary:     Effort: Pulmonary effort is normal. No respiratory distress.     Breath sounds: Normal breath sounds. No stridor. No wheezing, rhonchi or rales.  Abdominal:     General: Bowel sounds are normal. There is no distension.     Palpations: Abdomen is soft.     Tenderness: There is no abdominal tenderness.  Musculoskeletal:     Right lower leg: No edema.     Left lower leg: No edema.  Skin:    Findings: No erythema or rash.  Neurological:     Mental Status: She is alert and oriented to person, place, and time.     GCS: GCS eye subscore is 4. GCS verbal subscore is 5. GCS motor subscore is 6.     Cranial Nerves: Cranial nerves 2-12 are intact. No cranial nerve deficit, dysarthria or facial asymmetry.     Sensory: Sensation is intact. No sensory deficit.     Motor: Motor function is intact. No weakness, tremor, atrophy, abnormal muscle tone, seizure activity or pronator drift.     Coordination: Coordination is intact. Coordination normal. Finger-Nose-Finger Test and Heel to [facility] Test normal.  Gait: Gait is intact. Gait normal.     Vision Screening   Right eye Left eye Both eyes  Without correction 20/20 20/20 20/20   With correction        No results found for any visits on 05/11/22.  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    New problem This appears to be consistent with hemiplegic migraine We will refer to neurology for further evaluation We will go ahead and order MRI brain for worsening and more frequent headache I will also order verapamil 120 mg for abortive therapy as needed while patient awaits  appointment with neurology No neurological findings on exam today that would warrant stat imaging at this time, patient has returned to baseline She will follow-up in 2 months, hopefully after being evaluated by neurology      Relevant Medications   verapamil (CALAN-SR) 120 MG CR tablet   Other Relevant Orders   Ambulatory referral to Neurology   MR Brain Wo Contrast     Other   Need for Tdap vaccination    Tdap booster given today       Relevant Orders   Tdap vaccine greater than or equal to 7yo IM (Completed)   Need for immunization against influenza    Annual influenza vaccine administered today       Relevant Orders   Flu Vaccine QUAD 6+ mos PF IM (Fluarix Quad PF) (Completed)     Return in about 2 months (around 07/11/2022) for migraine .      I, 07/13/2022, MD, have reviewed all documentation for this visit. The documentation on 05/13/22 for the exam, diagnosis, procedures, and orders are all accurate and complete.  Portions of this information were initially documented by the CMA and reviewed by me for thoroughness and accuracy.      05/15/22, MD  [facility] [phone] (phone) [phone] (fax)  [facility]

## 2022-05-13 DIAGNOSIS — Z23 Encounter for immunization: Secondary | ICD-10-CM | POA: Insufficient documentation

## 2022-05-13 NOTE — Assessment & Plan Note (Signed)
New problem This appears to be consistent with hemiplegic migraine We will refer to neurology for further evaluation We will go ahead and order MRI brain for worsening and more frequent headache I will also order verapamil 120 mg for abortive therapy as needed while patient awaits appointment with neurology No neurological findings on exam today that would warrant stat imaging at this time, patient has returned to baseline She will follow-up in 2 months, hopefully after being evaluated by neurology

## 2022-05-13 NOTE — Assessment & Plan Note (Signed)
Annual influenza vaccine administered today. 

## 2022-05-13 NOTE — Assessment & Plan Note (Signed)
Tdap booster given today.

## 2022-05-19 ENCOUNTER — Ambulatory Visit
Admission: RE | Admit: 2022-05-19 | Discharge: 2022-05-19 | Disposition: A | Discharge status: Home / Self Care | Payer: Managed Care, Other (non HMO) | Source: Ambulatory Visit | Attending: Family Medicine | Admitting: Family Medicine

## 2022-05-19 DIAGNOSIS — G43409 Hemiplegic migraine, not intractable, without status migrainosus: Secondary | ICD-10-CM | POA: Diagnosis present

## 2022-06-15 ENCOUNTER — Other Ambulatory Visit: Payer: Self-pay | Admitting: Family Medicine

## 2022-06-15 NOTE — Telephone Encounter (Signed)
Requested Prescriptions  Pending Prescriptions Disp Refills   verapamil (CALAN-SR) 120 MG CR tablet [Pharmacy Med Name: verapamil ER (SR) 120 mg tablet,extended release] 90 tablet 1    Sig: Take 1 tablet (120 mg total) by mouth daily as needed (for complex migraine).     Cardiovascular: Calcium Channel Blockers 3 Passed - 06/15/2022 11:28 AM      Passed - ALT in normal range and within 360 days    ALT  Date Value Ref Range Status  03/02/2022 9 0 - 32 IU/L Final         Passed - AST in normal range and within 360 days    AST  Date Value Ref Range Status  03/02/2022 10 0 - 40 IU/L Final         Passed - Cr in normal range and within 360 days    Creatinine, Ser  Date Value Ref Range Status  03/02/2022 0.94 0.57 - 1.00 mg/dL Final         Passed - Last BP in normal range    BP Readings from Last 1 Encounters:  05/11/22 115/77         Passed - Last Heart Rate in normal range    Pulse Readings from Last 1 Encounters:  05/11/22 90         Passed - Valid encounter within last 6 months    Recent Outpatient Visits           1 month ago Hemiplegic migraine without status migrainosus, not intractable   [facility] Moyers, [place], MD   3 months ago Fever, unspecified fever cause   [facility] Maple Hudson., MD   1 year ago Chronic bronchitis, unspecified chronic bronchitis type [facility])   [facility] Maple Hudson., MD   1 year ago Walking pneumonia   [facility] Maple Hudson., MD   1 year ago History of ITP   [facility] Maple Hudson., MD

## 2022-06-17 ENCOUNTER — Ambulatory Visit: Payer: Self-pay

## 2022-06-17 NOTE — Progress Notes (Signed)
I,Connie R Striblin,acting as a Neurosurgeon for [employer], PA-C.,have documented all relevant documentation on the behalf of Alfredia Ferguson, PA-C,as directed by  Alfredia Ferguson, PA-C while in the presence of Alfredia Ferguson, PA-C.   Established patient visit   Patient: Destiny Phillips   DOB: April 18, 2001   21 y.o. Female  MRN: [id number] Visit Date: 06/18/2022  Today's healthcare provider: Alfredia Ferguson, PA-C   Cc. Rash x 4-7 days  Subjective    HPI  Rash: Patient complains of rash involving the  hands, feet, and mouth . Rash started 7 days ago.  Color of lesion(s): dark red and purple. Rash has not changed over time Initial distribution on arms  Discomfort associated with rash:  none .  Associated symptoms: none. . Patient has had previous evaluation of rash. Patient has had previous treatment of steroids which helped with rash. Pt presents with petechiae which she has had before due to low blood count.   Pt has a history of chronic ITP. She does not currently follow with a hematologist. She has an IUD and gets intermittent bleeding-- started what she thinks is her cycle yesterday. Reports flow is not heavy.   Medications: Outpatient Medications Prior to Visit  Medication Sig   verapamil (CALAN-SR) 120 MG CR tablet Take 1 tablet (120 mg total) by mouth daily as needed (for complex migraine).   No facility-administered medications prior to visit.    Review of Systems  Constitutional:  Negative for fatigue and fever.  Respiratory:  Negative for cough and shortness of breath.   Cardiovascular:  Negative for chest pain and leg swelling.  Gastrointestinal:  Negative for abdominal pain.  Genitourinary:  Positive for vaginal bleeding.  Skin:  Positive for rash.  Neurological:  Negative for dizziness and headaches.      Objective    Blood pressure 123/73, pulse 86, temperature 97.8 F (36.6 C), weight 114 lb (51.7 kg), SpO2 100 %.   Physical Exam Vitals reviewed.  Constitutional:       Appearance: She is not ill-appearing.  HENT:     Head: Normocephalic.  Eyes:     Conjunctiva/sclera: Conjunctivae normal.  Cardiovascular:     Rate and Rhythm: Normal rate.  Pulmonary:     Effort: Pulmonary effort is normal. No respiratory distress.  Skin:    Comments: Petechiae along lower extremities Along soft palate Some larger bruising along b/l upper arms  Neurological:     General: No focal deficit present.     Mental Status: She is alert and oriented to person, place, and time.  Psychiatric:        Mood and Affect: Mood normal.        Behavior: Behavior normal.     No results found for any visits on 06/18/22.  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      I, Alfredia Ferguson, PA-C have reviewed all documentation for this visit. The documentation on  06/18/2022 for the exam,  diagnosis, procedures, and orders are all accurate and complete.  Alfredia Ferguson, PA-C [facility] [address] [place], [place], [address] Office: [phone] Fax: [phone]   [facility]

## 2022-06-17 NOTE — Telephone Encounter (Signed)
Spoke with patient and scheduled appt with [facility] tomorrow, 12/01.

## 2022-06-17 NOTE — Telephone Encounter (Signed)
Patient would like to come in to have a CBC drawn because she thinks her platelets are low. Patient said the only symptom she has is a petechiae rash. Patient states that Dr. Sullivan Lone usually had a standing order for labs. No orders under current pcp. Patient said if you cannot reach her on her mobile to call her at work [phone].   Chief Complaint: Petechiae rash Symptoms: Arms, legs, trunk and inside mouth Frequency:  1 week ago Pertinent Negatives: Patient denies fever Disposition: [] ED /[] Urgent Care (no appt availability in office) / [] Appointment(In office/virtual)/ []  [facility]/ [] Home Care/ [] Refused Recommended Disposition /[] [place] [facility]/ [x]  Follow-up with PCP Additional Notes: Pt. Asking for order for CBC to check platelets. States she has a history of low platelets. Please advise. Declined OV, but states she will come in if PCP wants to see her.  Answer Assessment - Initial Assessment Questions 1. APPEARANCE of RASH: "Describe the rash." (e.g., spots, blisters, raised areas, skin peeling, scaly)     Small flat dots 2. SIZE: "How big are the spots?" (e.g., tip of pen, eraser, coin; inches, centimeters)     Tip of pen 3. LOCATION: "Where is the rash located?"     All over 4. COLOR: "What color is the rash?" (Note: It is difficult to assess rash color in people with darker-colored skin. When this situation occurs, simply ask the caller to describe what they see.)     Red-purple  5. ONSET: "When did the rash begin?"     2 weeks 6. FEVER: "Do you have a fever?" If Yes, ask: "What is your temperature, how was it measured, and when did it start?"     No 7. ITCHING: "Does the rash itch?" If Yes, ask: "How bad is the itch?" (Scale 1-10; or mild, moderate, severe)     No 8. CAUSE: "What do you think is causing the rash?"     Platelets 9. MEDICINE FACTORS: "Have you started any new medicines within the last 2 weeks?" (e.g., antibiotics)      No 10. OTHER  SYMPTOMS: "Do you have any other symptoms?" (e.g., dizziness, headache, sore throat, joint pain)       Spots in mouth 11. PREGNANCY: "Is there any chance you are pregnant?" "When was your last menstrual period?"       No  Protocols used: Rash or Redness - [facility]

## 2022-06-18 ENCOUNTER — Ambulatory Visit (INDEPENDENT_AMBULATORY_CARE_PROVIDER_SITE_OTHER): Payer: Managed Care, Other (non HMO) | Admitting: Physician Assistant

## 2022-06-18 ENCOUNTER — Encounter: Payer: Self-pay | Admitting: Physician Assistant

## 2022-06-18 VITALS — BP 123/73 | HR 86 | Temp 97.8°F | Wt 114.0 lb

## 2022-06-18 DIAGNOSIS — D693 Immune thrombocytopenic purpura: Secondary | ICD-10-CM | POA: Insufficient documentation

## 2022-06-18 MED ORDER — DEXAMETHASONE 4 MG PO TABS: 40.0000 mg | ORAL_TABLET | Freq: Every day | ORAL | 0 refills | Status: AC

## 2022-06-18 NOTE — Assessment & Plan Note (Addendum)
Rx decadron 40 mg x 4 days Ordered cbc plt 178k 8/23 Referred to heme Given ED precautions surrounding bleeding

## 2022-06-19 ENCOUNTER — Encounter: Payer: Self-pay | Admitting: Physician Assistant

## 2022-06-21 ENCOUNTER — Other Ambulatory Visit: Payer: Self-pay

## 2022-06-21 DIAGNOSIS — D693 Immune thrombocytopenic purpura: Secondary | ICD-10-CM

## 2022-06-21 LAB — CBC WITH DIFFERENTIAL/PLATELET
Basophils Absolute: 0 10*3/uL (ref 0.0–0.2)
Basos: 1 %
EOS (ABSOLUTE): 0.1 10*3/uL (ref 0.0–0.4)
Eos: 2 %
Hematocrit: 34.1 % (ref 34.0–46.6)
Hemoglobin: 11.1 g/dL (ref 11.1–15.9)
Immature Grans (Abs): 0 10*3/uL (ref 0.0–0.1)
Immature Granulocytes: 1 %
Lymphocytes Absolute: 1.5 10*3/uL (ref 0.7–3.1)
Lymphs: 37 %
MCH: 24.8 pg — ABNORMAL LOW (ref 26.6–33.0)
MCHC: 32.6 g/dL (ref 31.5–35.7)
MCV: 76 fL — ABNORMAL LOW (ref 79–97)
Monocytes Absolute: 0.3 10*3/uL (ref 0.1–0.9)
Monocytes: 8 %
Neutrophils Absolute: 2.1 10*3/uL (ref 1.4–7.0)
Neutrophils: 51 %
Platelets: 18 10*3/uL — CL (ref 150–450)
RBC: 4.48 x10E6/uL (ref 3.77–5.28)
RDW: 13.3 % (ref 11.7–15.4)
WBC: 4.1 10*3/uL (ref 3.4–10.8)

## 2022-06-24 LAB — CBC WITH DIFFERENTIAL/PLATELET
Basophils Absolute: 0 10*3/uL (ref 0.0–0.2)
Basos: 0 %
EOS (ABSOLUTE): 0 10*3/uL (ref 0.0–0.4)
Eos: 0 %
Hematocrit: 34.3 % (ref 34.0–46.6)
Hemoglobin: 10.9 g/dL — ABNORMAL LOW (ref 11.1–15.9)
Immature Grans (Abs): 0.1 10*3/uL (ref 0.0–0.1)
Immature Granulocytes: 1 %
Lymphocytes Absolute: 2 10*3/uL (ref 0.7–3.1)
Lymphs: 27 %
MCH: 24.4 pg — ABNORMAL LOW (ref 26.6–33.0)
MCHC: 31.8 g/dL (ref 31.5–35.7)
MCV: 77 fL — ABNORMAL LOW (ref 79–97)
Monocytes Absolute: 0.6 10*3/uL (ref 0.1–0.9)
Monocytes: 8 %
Neutrophils Absolute: 4.9 10*3/uL (ref 1.4–7.0)
Neutrophils: 64 %
Platelets: 134 10*3/uL — ABNORMAL LOW (ref 150–450)
RBC: 4.47 x10E6/uL (ref 3.77–5.28)
RDW: 13.8 % (ref 11.7–15.4)
WBC: 7.6 10*3/uL (ref 3.4–10.8)

## 2022-07-23 ENCOUNTER — Inpatient Hospital Stay: Payer: Managed Care, Other (non HMO)

## 2022-07-23 ENCOUNTER — Inpatient Hospital Stay: Payer: Managed Care, Other (non HMO) | Attending: Oncology | Admitting: Oncology

## 2022-07-23 ENCOUNTER — Encounter: Payer: Self-pay | Admitting: Oncology

## 2022-07-23 VITALS — BP 116/71 | HR 107 | Temp 97.7°F | Wt 114.0 lb

## 2022-07-23 DIAGNOSIS — D509 Iron deficiency anemia, unspecified: Secondary | ICD-10-CM

## 2022-07-23 DIAGNOSIS — R233 Spontaneous ecchymoses: Secondary | ICD-10-CM | POA: Insufficient documentation

## 2022-07-23 DIAGNOSIS — Z800 Family history of malignant neoplasm of digestive organs: Secondary | ICD-10-CM | POA: Insufficient documentation

## 2022-07-23 DIAGNOSIS — E611 Iron deficiency: Secondary | ICD-10-CM | POA: Diagnosis not present

## 2022-07-23 DIAGNOSIS — D693 Immune thrombocytopenic purpura: Secondary | ICD-10-CM | POA: Diagnosis present

## 2022-07-23 DIAGNOSIS — R918 Other nonspecific abnormal finding of lung field: Secondary | ICD-10-CM | POA: Insufficient documentation

## 2022-07-23 DIAGNOSIS — D809 Immunodeficiency with predominantly antibody defects, unspecified: Secondary | ICD-10-CM

## 2022-07-23 DIAGNOSIS — R21 Rash and other nonspecific skin eruption: Secondary | ICD-10-CM | POA: Insufficient documentation

## 2022-07-23 DIAGNOSIS — Z803 Family history of malignant neoplasm of breast: Secondary | ICD-10-CM | POA: Insufficient documentation

## 2022-07-23 LAB — RETIC PANEL
Immature Retic Fract: 9.2 % (ref 2.3–15.9)
RBC.: 5 MIL/uL (ref 3.87–5.11)
Retic Count, Absolute: 62 10*3/uL (ref 19.0–186.0)
Retic Ct Pct: 1.2 % (ref 0.4–3.1)
Reticulocyte Hemoglobin: 28.5 pg (ref >27.9)

## 2022-07-23 LAB — CBC WITH DIFFERENTIAL/PLATELET
Abs Immature Granulocytes: 0.04 10*3/uL (ref 0.00–0.07)
Basophils Absolute: 0 10*3/uL (ref 0.0–0.1)
Basophils Relative: 0 %
Eosinophils Absolute: 0.1 10*3/uL (ref 0.0–0.5)
Eosinophils Relative: 1 %
HCT: 38.2 % (ref 36.0–46.0)
Hemoglobin: 12.3 g/dL (ref 12.0–15.0)
Immature Granulocytes: 1 %
Lymphocytes Relative: 19 %
Lymphs Abs: 1.1 10*3/uL (ref 0.7–4.0)
MCH: 24.6 pg — ABNORMAL LOW (ref 26.0–34.0)
MCHC: 32.2 g/dL (ref 30.0–36.0)
MCV: 76.4 fL — ABNORMAL LOW (ref 80.0–100.0)
Monocytes Absolute: 0.3 10*3/uL (ref 0.1–1.0)
Monocytes Relative: 6 %
Neutro Abs: 4 10*3/uL (ref 1.7–7.7)
Neutrophils Relative %: 73 %
Platelets: 150 10*3/uL (ref 150–400)
RBC: 5 MIL/uL (ref 3.87–5.11)
RDW: 14.2 % (ref 11.5–15.5)
WBC: 5.6 10*3/uL (ref 4.0–10.5)
nRBC: 0 % (ref 0.0–0.2)

## 2022-07-23 LAB — FERRITIN: Ferritin: 18 ng/mL (ref 11–307)

## 2022-07-23 LAB — VITAMIN B12: Vitamin B-12: 295 pg/mL (ref 180–914)

## 2022-07-23 LAB — FOLATE: Folate: 10.3 ng/mL (ref >5.9)

## 2022-07-23 LAB — IRON AND TIBC
Iron: 43 ug/dL (ref 28–170)
Saturation Ratios: 10 % — ABNORMAL LOW (ref 10.4–31.8)
TIBC: 430 ug/dL (ref 250–450)
UIBC: 387 ug/dL

## 2022-07-23 LAB — LACTATE DEHYDROGENASE: LDH: 122 U/L (ref 98–192)

## 2022-07-23 LAB — IMMATURE PLATELET FRACTION: Immature Platelet Fraction: 1.2 % (ref 1.2–8.6)

## 2022-07-23 NOTE — Progress Notes (Signed)
Patient is referred here by Dr. Thedore Mins for idiopathic thrombocytopenia.

## 2022-07-23 NOTE — Assessment & Plan Note (Signed)
Microcytic anemia with a hemoglobin of 10 recently.  Repeat CBC showed normalized hemoglobin.  Iron panel showed slightly decreased iron saturation.  Consistent with early iron deficiency.  Recommend oral over-the-counter iron supplementation.

## 2022-07-23 NOTE — Assessment & Plan Note (Signed)
With recent flare of acute ITP responded to dexamethasone 40 mg daily for 4 days. Check CBC, CMP, B12, folate, flow cytometry.  It appears that ITP always respond very well to steroid treatments. Discussed about future options if recurrent ITP or refractory ITP.  These options include splenectomy, CD20 antibodies, TPO agonist.  Currently given that her ITP responses very well to steroids and no frequent recurrence.  I recommend continue observation and check blood work every 6 months.  She agrees with the plan.

## 2022-07-23 NOTE — Progress Notes (Signed)
Hematology/Oncology Consult note [phone] [phone]         Patient Care Team: [name], MD as PCP - General (Family Medicine)  REFERRING PROVIDER: Mikey Kirschner, PA-C   CHIEF COMPLAINTS/REASON FOR VISIT:  Evaluation of ITP  ASSESSMENT & PLAN:   Chronic ITP (idiopathic thrombocytopenia) ([place]) With recent flare of acute ITP responded to dexamethasone 40 mg daily for 4 days. Check CBC, CMP, B12, folate, flow cytometry.  It appears that ITP always respond very well to steroid treatments. Discussed about future options if recurrent ITP or refractory ITP.  These options include splenectomy, CD20 antibodies, TPO agonist.  Currently given that her ITP responses very well to steroids and no frequent recurrence.  I recommend continue observation and check blood work every 6 months.  She agrees with the plan.  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  Repeat CBC showed normalized hemoglobin.  Iron panel showed slightly decreased iron saturation.  Consistent with early iron deficiency.  Recommend oral over-the-counter iron supplementation.   Orders Placed This Encounter  Procedures   CBC with Differential/Platelet    Standing Status:   Future    Number of Occurrences:   1    Standing Expiration Date:   07/24/2023   Ferritin    Standing Status:   Future    Number of Occurrences:   1    Standing Expiration Date:   01/21/2023   Iron and TIBC    Standing Status:   Future    Number of Occurrences:   1    Standing Expiration Date:   07/24/2023   Retic Panel    Standing Status:   Future    Number of Occurrences:   1    Standing Expiration Date:   07/24/2023   Immature Platelet Fraction    Standing Status:   Future    Number of Occurrences:   1    Standing Expiration Date:   07/24/2023   Flow  cytometry panel-leukemia/lymphoma work-up    Standing Status:   Future    Number of Occurrences:   1    Standing Expiration Date:   07/24/2023   Lactate dehydrogenase    Standing Status:   Future    Number of Occurrences:   1    Standing Expiration Date:   07/24/2023   Folate    Standing Status:   Future    Number of Occurrences:   1    Standing Expiration Date:   07/24/2023   Vitamin B12    Standing Status:   Future    Number of Occurrences:   1    Standing Expiration Date:   07/24/2023   CBC with Differential/Platelet    Standing Status:   Future    Standing Expiration Date:   07/23/2023   Comprehensive metabolic panel    Standing Status:   Future    Standing Expiration Date:   07/23/2023   Follow-up in 6 months All questions were answered. The patient knows to call the clinic with any problems, questions or concerns.  Earlie Server, MD, PhD [facility] Hematology Oncology 07/23/2022   HISTORY OF PRESENTING ILLNESS:   Destiny Phillips is a  22 y.o.  female with PMH listed below was seen in consultation at the request of  Drubel, Ria Comment, PA-C  for evaluation of ITP Patient has a history of chronic ITP Previously seen  by pediatric hematologist at [facility].  Reviewed previous hematologist note.  She was last seen in 2018 there.  History of acute ITP, she has previously tried IVIG and it did not response.  She responded to steroids well.  ITP always resolves after steroid treatments. She had 1 episode as a young child, then in 2010, and 2017.  Recently she had another acute ITP flare with platelet count dropped to 18,000.  She has noticed petechiae rash.  Her primary care provider prescribed dexamethasone 40 mg daily for 4 days and a follow-up CBC showed a platelet count of 134,000.  Patient was referred to establish care with hematology.  May 2023, patient was seen by [facility] pulmonologist Dr.Meier for patchy airspace opacities in her lungs..  Patient underwent extensive  pulmonology workup she had bronchoscopy biopsy-negative for malignancy.  ANA is negative it was noted that her immunoglobulin levels were decreased-low IgA, IgG and IgM..  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  She has not established care yet.   MEDICAL HISTORY:  Past Medical History:  Diagnosis Date   Idiopathic thrombocytopenic purpura (ITP) (HCC)    SVT (supraventricular tachycardia)     SURGICAL HISTORY: Past Surgical History:  Procedure Laterality Date   BRONCHIAL BIOPSY  11/16/2021   Procedure: BRONCHIAL BIOPSIES;  Surgeon: Collene Gobble, MD;  Location: [facility] ENDOSCOPY;  Service: Pulmonary;;   BRONCHIAL BRUSHINGS  11/16/2021   Procedure: BRONCHIAL BRUSHINGS;  Surgeon: Collene Gobble, MD;  Location: [facility] ENDOSCOPY;  Service: Pulmonary;;   BRONCHIAL NEEDLE ASPIRATION BIOPSY  11/16/2021   Procedure: BRONCHIAL NEEDLE ASPIRATION BIOPSIES;  Surgeon: Collene Gobble, MD;  Location: [facility] ENDOSCOPY;  Service: Pulmonary;;   BRONCHIAL WASHINGS  11/16/2021   Procedure: BRONCHIAL WASHINGS;  Surgeon: Collene Gobble, MD;  Location: [facility] ENDOSCOPY;  Service: Pulmonary;;   CARDIAC ELECTROPHYSIOLOGY STUDY AND ABLATION     at [place]  11/16/2021   Procedure: [place];  Surgeon: Collene Gobble, MD;  Location: [facility] ENDOSCOPY;  Service: Pulmonary;;    SOCIAL HISTORY: Social History   Socioeconomic History   Marital status: Single    Spouse name: Not on file   Number of children: Not on file   Years of education: Not on file   Highest education level: Not on file  Occupational History   Not on file  Tobacco Use   Smoking status: Never   Smokeless tobacco: Never  Substance and Sexual Activity   Alcohol use: Never   Drug use: Never   Sexual activity: Not on file  Other Topics Concern   Not on file  Social History Narrative   Not on file   Social  Determinants of Health   Financial Resource Strain: Not on file  Food Insecurity: Not on file  Transportation Needs: Not on file  Physical Activity: Not on file  Stress: Not on file  Social Connections: Not on file  Intimate Partner Violence: Not on file    FAMILY HISTORY: Family History  Problem Relation Age of Onset   Healthy Mother    Healthy Father    Cancer Paternal Uncle    Breast cancer Maternal Grandmother    Diabetes Maternal Grandfather    Colon cancer Paternal Grandfather     ALLERGIES:  has No Known Allergies.  MEDICATIONS:  Current Outpatient Medications  Medication Sig Dispense Refill   Bacillus  Coagulans-Inulin (ALIGN PREBIOTIC-PROBIOTIC) 5-1.25 MG-GM CHEW Chew by mouth.     verapamil (CALAN-SR) 120 MG CR tablet Take 1 tablet (120 mg total) by mouth daily as needed (for complex migraine). 90 tablet 1   No current facility-administered medications for this visit.    Review of Systems  Constitutional:  Negative for appetite change, chills, fatigue and fever.  HENT:   Negative for hearing loss and voice change.   Eyes:  Negative for eye problems.  Respiratory:  Negative for chest tightness and cough.   Cardiovascular:  Negative for chest pain.  Gastrointestinal:  Negative for abdominal distention, abdominal pain and blood in stool.  Endocrine: Negative for hot flashes.  Genitourinary:  Negative for difficulty urinating and frequency.   Musculoskeletal:  Negative for arthralgias.  Skin:  Negative for itching and rash.  Neurological:  Negative for extremity weakness.  Hematological:  Negative for adenopathy. Bruises/bleeds easily.  Psychiatric/Behavioral:  Negative for confusion.    PHYSICAL EXAMINATION: Vitals:   07/23/22 0931  BP: 116/71  Pulse: (!) 107  Temp: 97.7 F (36.5 C)  SpO2: 100%   Filed Weights   07/23/22 0931  Weight: 114 lb (51.7 kg)    Physical Exam Constitutional:      General: She is not in acute distress. HENT:     Head:  Normocephalic and atraumatic.  Eyes:     General: No scleral icterus. Cardiovascular:     Rate and Rhythm: Normal rate and regular rhythm.     Heart sounds: Normal heart sounds.  Pulmonary:     Effort: Pulmonary effort is normal. No respiratory distress.     Breath sounds: No wheezing.  Abdominal:     General: Bowel sounds are normal. There is no distension.     Palpations: Abdomen is soft.  Musculoskeletal:        General: No deformity. Normal range of motion.     Cervical back: Normal range of motion and neck supple.  Skin:    General: Skin is warm and dry.     Findings: No erythema or rash.  Neurological:     Mental Status: She is alert and oriented to person, place, and time. Mental status is at baseline.     Cranial Nerves: No cranial nerve deficit.     Coordination: Coordination normal.  Psychiatric:        Mood and Affect: Mood normal.     LABORATORY DATA:  I have reviewed the data as listed    Latest Ref Rng & Units 07/23/2022   10:16 AM 06/23/2022    2:52 PM 06/18/2022    3:38 PM  CBC  WBC 4.0 - 10.5 K/uL 5.6  7.6  4.1   Hemoglobin 12.0 - 15.0 g/dL 12.3  10.9  11.1   Hematocrit 36.0 - 46.0 % 38.2  34.3  34.1   Platelets 150 - 400 K/uL 150  134  18       Latest Ref Rng & Units 03/02/2022    2:50 PM 02/18/2022    1:16 PM 12/11/2020    7:04 PM  CMP  Glucose 70 - 99 mg/dL 162  146  90   BUN 6 - 20 mg/dL 8  7  15    Creatinine 0.57 - 1.00 mg/dL 0.94  0.70  0.75   Sodium 134 - 144 mmol/L 136  139  134   Potassium 3.5 - 5.2 mmol/L 4.5  4.4  3.8   Chloride 96 - 106 mmol/L 98  100  99  CO2 20 - 29 mmol/L 21  18  28    Calcium 8.7 - 10.2 mg/dL 9.2  9.6  9.4   Total Protein 6.0 - 8.5 g/dL 6.3  6.4    Total Bilirubin 0.0 - 1.2 mg/dL 0.5  0.3    Alkaline Phos 44 - 121 IU/L 103  106    AST 0 - 40 IU/L 10  13    ALT 0 - 32 IU/L 9  11        RADIOGRAPHIC STUDIES: I have personally reviewed the radiological images as listed and agreed with the findings in the  report. MR Brain Wo Contrast  Result Date: 05/19/2022 CLINICAL DATA:  Provided history: Headache, new or worsening, neuro deficit. Additional history provided: Patient reports numbness in right face and right arm, loss of peripheral vision beginning 3 weeks ago. EXAM: MRI HEAD WITHOUT CONTRAST TECHNIQUE: Multiplanar, multiecho pulse sequences of the brain and surrounding structures were obtained without intravenous contrast. COMPARISON:  No pertinent prior exams available for comparison. FINDINGS: Brain: Cerebral volume is normal. No cortical encephalomalacia is identified. No significant cerebral white matter disease. There is no acute infarct. No evidence of an intracranial mass. No chronic intracranial blood products. No extra-axial fluid collection. No midline shift. Vascular: Maintained flow voids within the proximal large arterial vessels. Skull and upper cervical spine: No focal suspicious marrow lesion. Sinuses/Orbits: No mass or acute finding within the imaged orbits. Trace mucosal thickening within the right maxillary sinus. IMPRESSION: Unremarkable non-contrast MRI appearance of the brain. No evidence of acute intracranial abnormality. Electronically Signed   By: Kellie Simmering D.O.   On: 05/19/2022 15:08

## 2022-07-23 NOTE — Assessment & Plan Note (Signed)
Recommend patient to establish care with immunology for further evaluation.  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

## 2022-07-26 LAB — COMP PANEL: LEUKEMIA/LYMPHOMA

## 2022-08-25 ENCOUNTER — Encounter: Payer: Self-pay | Admitting: Student

## 2022-08-25 NOTE — Telephone Encounter (Signed)
Mychart message sent by pt:  [facility] (supporting Maryjane Hurter, MD)49 minutes ago (10:24 AM)    Hey Dr. Verlee Monte, My name is Azalya Galyon and I was a patient of yours almost a year and a half ago now, potentially longer. I apologize for my incredibly delayed response but you mentioned wanting to refer me to an immunologist for CVID. Can you recommend anyone that you trust that is affiliated with [facility]? If not, I will go where you suggest but [facility] is more convenient with my work schedule. Thank you!    Dr. Verlee Monte, please advise.
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
## 2022-08-30 ENCOUNTER — Encounter: Payer: Self-pay | Admitting: Family Medicine

## 2022-08-30 ENCOUNTER — Ambulatory Visit (INDEPENDENT_AMBULATORY_CARE_PROVIDER_SITE_OTHER): Payer: Managed Care, Other (non HMO) | Admitting: Family Medicine

## 2022-08-30 VITALS — BP 111/76 | HR 102 | Temp 98.2°F | Resp 16 | Wt 112.5 lb

## 2022-08-30 DIAGNOSIS — R350 Frequency of micturition: Secondary | ICD-10-CM

## 2022-08-30 DIAGNOSIS — D693 Immune thrombocytopenic purpura: Secondary | ICD-10-CM

## 2022-08-30 DIAGNOSIS — R300 Dysuria: Secondary | ICD-10-CM

## 2022-08-30 LAB — POCT URINALYSIS DIPSTICK
Bilirubin, UA: NEGATIVE
Glucose, UA: NEGATIVE
Ketones, UA: NEGATIVE
Leukocytes, UA: NEGATIVE
Nitrite, UA: NEGATIVE
Protein, UA: NEGATIVE
Spec Grav, UA: 1.02 (ref 1.010–1.025)
Urobilinogen, UA: 0.2 E.U./dL
pH, UA: 6.5 (ref 5.0–8.0)

## 2022-08-30 NOTE — Assessment & Plan Note (Addendum)
Acute  Reports some urinary urgency and frequency  U/a negative  Will send for urine culture and follow up with results  Patient has normal abdominal exam today without CVA tenderness  Will treat pending urine culture results

## 2022-08-30 NOTE — Assessment & Plan Note (Signed)
Chronic  Stable  Do not appreciate areas of bruising nor petechiae on oropharynx today  Patient otherwise asymptomatic, no joint swelling, currently on menses Will check CBC today to obtain platelet level

## 2022-08-31 LAB — CBC
Hematocrit: 39.1 % (ref 34.0–46.6)
Hemoglobin: 12.6 g/dL (ref 11.1–15.9)
MCH: 24.9 pg — ABNORMAL LOW (ref 26.6–33.0)
MCHC: 32.2 g/dL (ref 31.5–35.7)
MCV: 77 fL — ABNORMAL LOW (ref 79–97)
Platelets: 144 10*3/uL — ABNORMAL LOW (ref 150–450)
RBC: 5.06 x10E6/uL (ref 3.77–5.28)
RDW: 13.9 % (ref 11.7–15.4)
WBC: 3 10*3/uL — ABNORMAL LOW (ref 3.4–10.8)

## 2022-09-01 ENCOUNTER — Other Ambulatory Visit: Payer: Self-pay

## 2022-09-01 DIAGNOSIS — D72819 Decreased white blood cell count, unspecified: Secondary | ICD-10-CM

## 2022-09-01 LAB — URINE CULTURE

## 2022-09-02 ENCOUNTER — Other Ambulatory Visit: Payer: Self-pay | Admitting: Family Medicine

## 2022-09-02 DIAGNOSIS — R8271 Bacteriuria: Secondary | ICD-10-CM

## 2022-09-02 MED ORDER — PENICILLIN V POTASSIUM 500 MG PO TABS: 500.0000 mg | ORAL_TABLET | Freq: Three times a day (TID) | ORAL | 0 refills | Status: AC

## 2022-09-16 ENCOUNTER — Other Ambulatory Visit: Payer: Self-pay | Admitting: Family Medicine

## 2022-09-17 LAB — CBC WITH DIFFERENTIAL/PLATELET
Basophils Absolute: 0 10*3/uL (ref 0.0–0.2)
Basos: 0 %
EOS (ABSOLUTE): 0.1 10*3/uL (ref 0.0–0.4)
Eos: 1 %
Hematocrit: 39.8 % (ref 34.0–46.6)
Hemoglobin: 13 g/dL (ref 11.1–15.9)
Immature Grans (Abs): 0 10*3/uL (ref 0.0–0.1)
Immature Granulocytes: 0 %
Lymphocytes Absolute: 2 10*3/uL (ref 0.7–3.1)
Lymphs: 29 %
MCH: 26.4 pg — ABNORMAL LOW (ref 26.6–33.0)
MCHC: 32.7 g/dL (ref 31.5–35.7)
MCV: 81 fL (ref 79–97)
Monocytes Absolute: 0.4 10*3/uL (ref 0.1–0.9)
Monocytes: 6 %
Neutrophils Absolute: 4.2 10*3/uL (ref 1.4–7.0)
Neutrophils: 64 %
Platelets: 143 10*3/uL — ABNORMAL LOW (ref 150–450)
RBC: 4.92 x10E6/uL (ref 3.77–5.28)
RDW: 14.4 % (ref 11.7–15.4)
WBC: 6.8 10*3/uL (ref 3.4–10.8)

## 2022-09-21 ENCOUNTER — Encounter: Payer: Self-pay | Admitting: Family Medicine

## 2022-09-21 ENCOUNTER — Ambulatory Visit (INDEPENDENT_AMBULATORY_CARE_PROVIDER_SITE_OTHER): Payer: Managed Care, Other (non HMO) | Admitting: Family Medicine

## 2022-09-21 VITALS — BP 103/71 | HR 85 | Resp 16 | Wt 115.0 lb

## 2022-09-21 DIAGNOSIS — D693 Immune thrombocytopenic purpura: Secondary | ICD-10-CM

## 2022-09-21 NOTE — Assessment & Plan Note (Signed)
Chronic  Stable platelets Hgb WNL  No medication changes today

## 2022-09-21 NOTE — Progress Notes (Signed)
I,[name],acting as a scribe for [employer], MD.,have documented all relevant documentation on the behalf of [name], MD,as directed by  [name], MD while in the presence of [name], MD.   Established patient visit   Patient: Destiny Phillips   DOB: [birth year]   22 y.o. Female  MRN: [id number] Visit Date: 09/21/2022  Today's healthcare provider: [name], MD   Chief Complaint  Patient presents with   Discuss labs   Subjective    HPI   Patient here to discuss labs for thrombocytopenia  Platelets were stable at 143 WBC improved to 6.8 from 3.0 previously  Patient denies development of petechiae  She denies abnormal bleeding since last visit    Medications: Outpatient Medications Prior to Visit  Medication Sig   Bacillus Coagulans-Inulin (ALIGN PREBIOTIC-PROBIOTIC) 5-1.25 MG-GM CHEW Chew by mouth.   METHYLCOBALAMIN PO Take 1 mg by mouth. 2-3 capsules a day   verapamil (CALAN-SR) 120 MG CR tablet Take 1 tablet (120 mg total) by mouth daily as needed (for complex migraine).   No facility-administered medications prior to visit.    Review of Systems     Objective    BP 103/71 (BP Location: Left Arm, Patient Position: Sitting, Cuff Size: Normal)   Pulse 85   Resp 16   Wt 115 lb (52.2 kg)   BMI 20.37 kg/m    Physical Exam Vitals reviewed.  Constitutional:      General: She is not in acute distress.    Appearance: Normal appearance. She is not ill-appearing, toxic-appearing or diaphoretic.  Eyes:     Conjunctiva/sclera: Conjunctivae normal.  Neck:     Thyroid: No thyroid mass, thyromegaly or thyroid tenderness.     Vascular: No carotid bruit.  Cardiovascular:     Rate and Rhythm: Normal rate and regular rhythm.     Pulses: Normal pulses.     Heart sounds: Normal heart sounds. No murmur heard.    No friction rub. No gallop.  Pulmonary:     Effort: Pulmonary effort is normal.  No respiratory distress.     Breath sounds: Normal breath sounds. No stridor. No wheezing, rhonchi or rales.  Abdominal:     General: Bowel sounds are normal. There is no distension.     Palpations: Abdomen is soft.     Tenderness: There is no abdominal tenderness.  Musculoskeletal:     Right lower leg: No edema.     Left lower leg: No edema.  Lymphadenopathy:     Cervical: No cervical adenopathy.  Skin:    Findings: No erythema or rash.  Neurological:     Mental Status: She is alert and oriented to person, place, and time.       No results found for any visits on 09/21/22.  Assessment & Plan     Problem List Items Addressed This Visit       Musculoskeletal and Integument   Chronic ITP (idiopathic thrombocytopenia) (HCC) - Primary (Chronic)    Chronic  Stable platelets Hgb WNL  No medication changes today        Relevant Medications   METHYLCOBALAMIN PO     Return in about 6 months (around 03/24/2023) for CPE.       The entirety of the information documented in the History of Present Illness, Review of Systems and Physical Exam were personally obtained by me. Portions of this information were initially documented by Lyndel Pleasure, CMA . I, [name], MD have reviewed  the documentation above for thoroughness and accuracy.   [name], MD  [facility] [phone] (phone) [phone] (fax)  [place]

## 2022-10-01 IMAGING — CT CT CHEST SUPER D W/O CM
2 of 5 series · 15 of 36 positions shown, 18 images · non-contrast
Comparison: 10/12/2021

CLINICAL DATA: Lung nodules, preprocedural for bronchoscopy

EXAM:
CT CHEST WITHOUT CONTRAST
TECHNIQUE: Multidetector CT imaging of the chest was performed using thin slice
collimation for electromagnetic bronchoscopy planning purposes,
without intravenous contrast.
RADIATION DOSE REDUCTION: This exam was performed according to the
departmental dose-optimization program which includes automated
exposure control, adjustment of the mA and/or kV according to
patient size and/or use of iterative reconstruction technique.

[Series 4: thins · axial · 0.62mm/px · z∈[-292,-29]mm · 12 of 434 slices shown, 15 images]
[im 29/434  mediastinal]
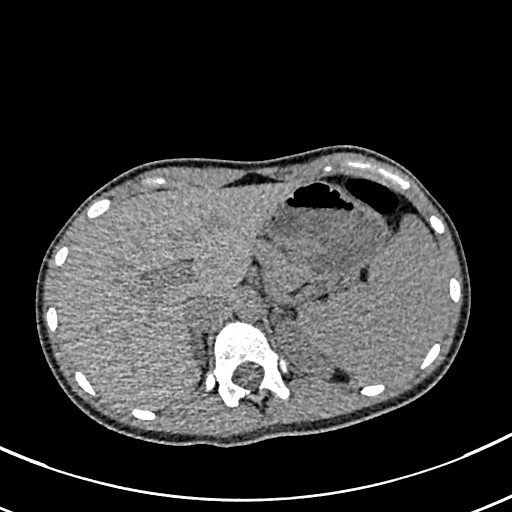
[im 29/434  lung]
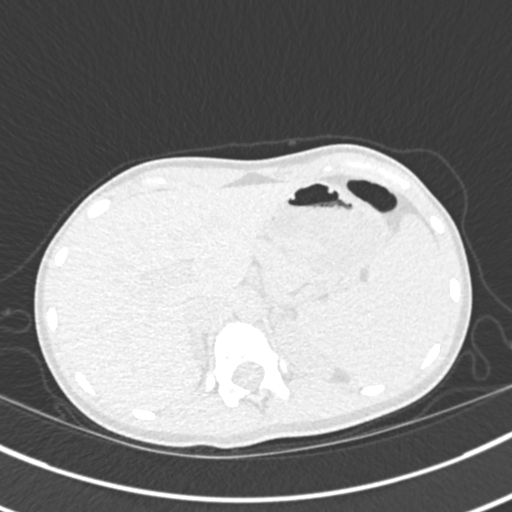
[im 58/434  lung]
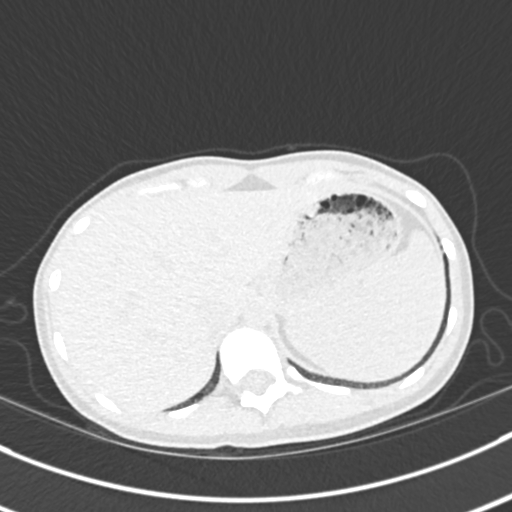
[im 87/434  lung]
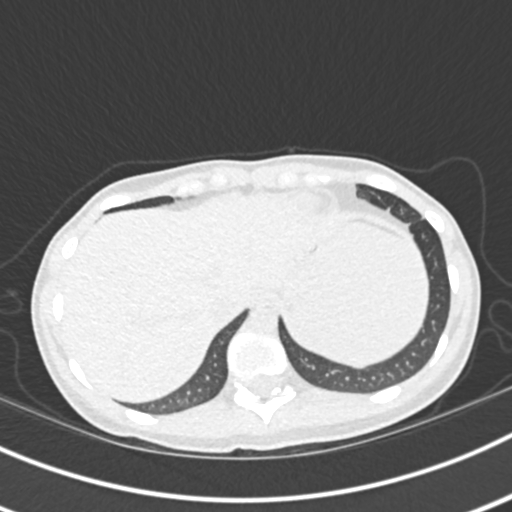
[im 145/434  lung]
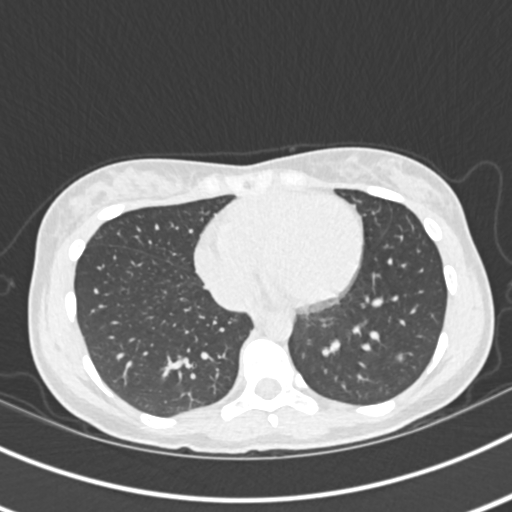
[im 174/434  mediastinal]
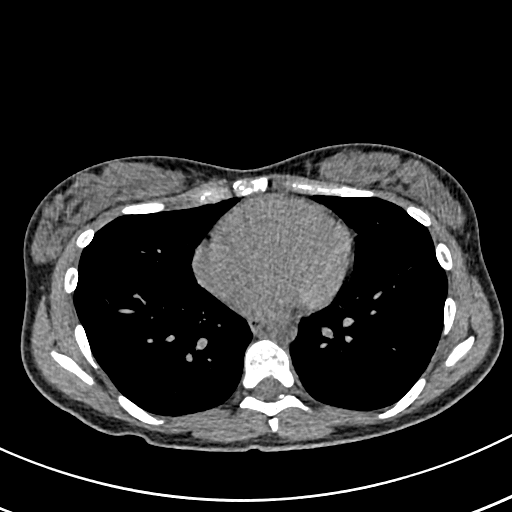
[im 174/434  lung]
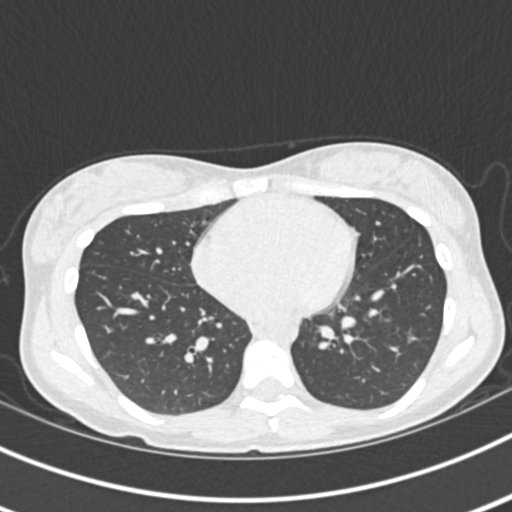
[im 203/434  lung]
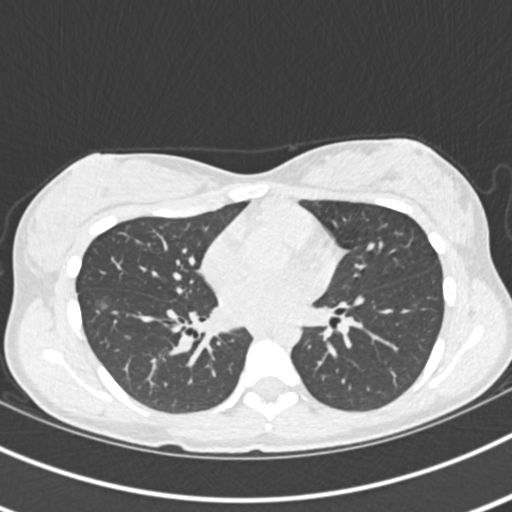
[im 231/434  lung]
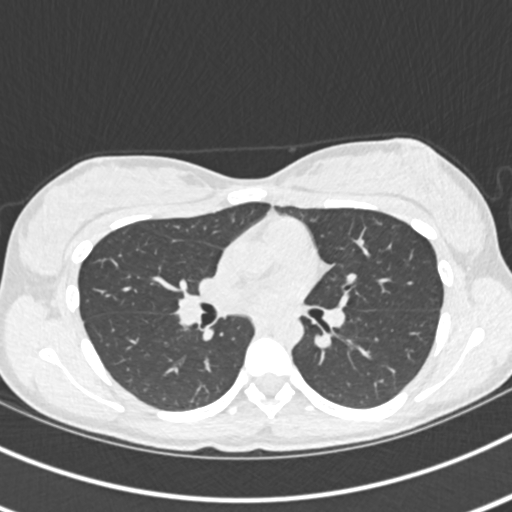
[im 260/434  lung]
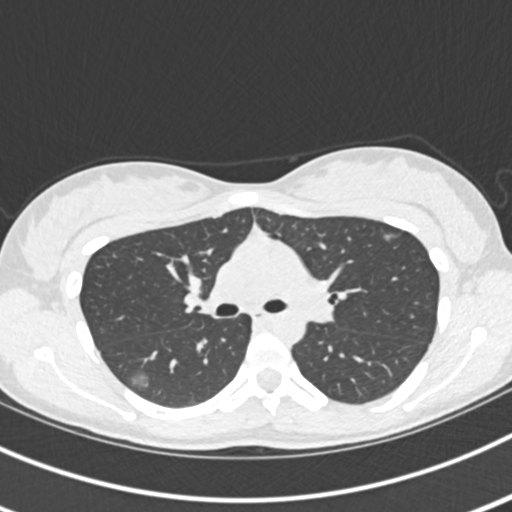
[im 289/434  mediastinal]
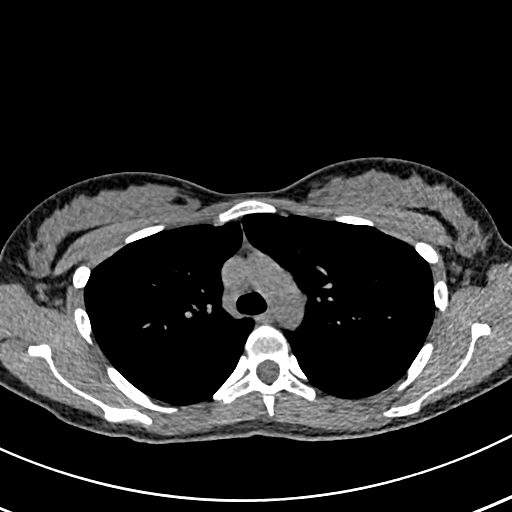
[im 289/434  lung]
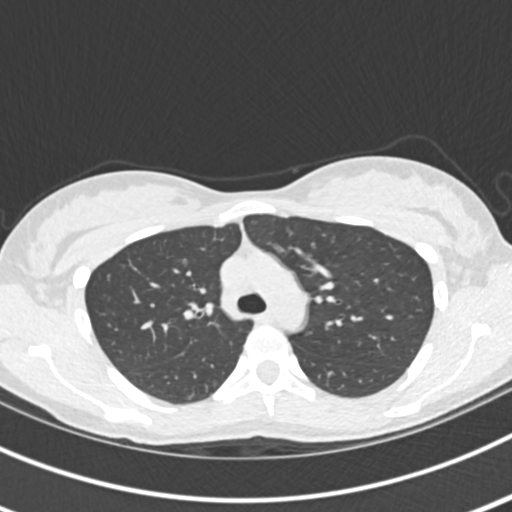
[im 347/434  lung]
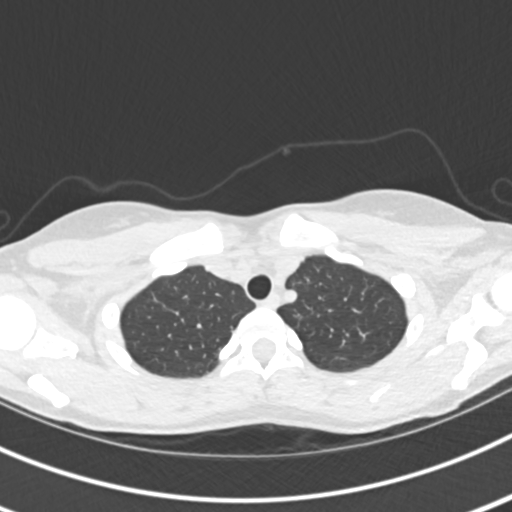
[im 376/434  lung]
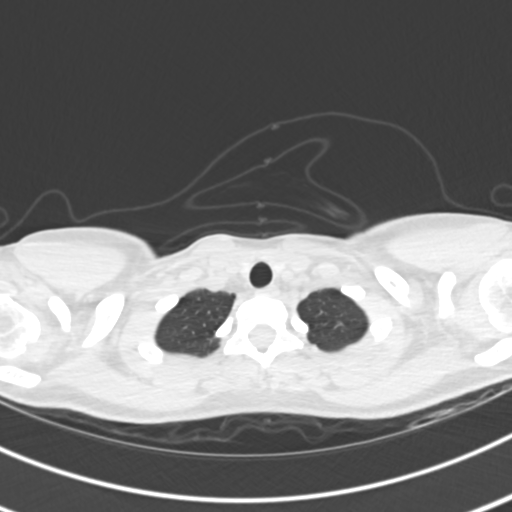
[im 405/434  lung]
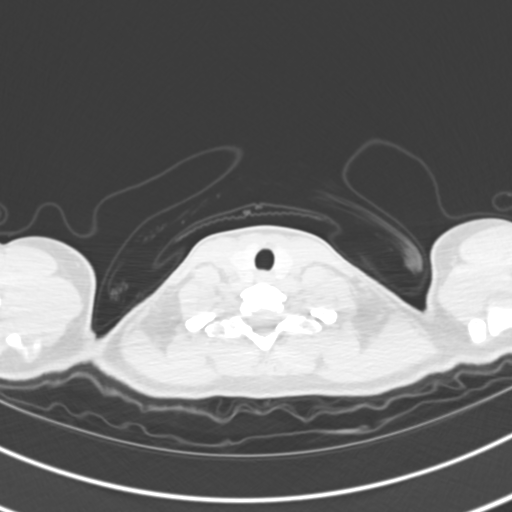

[Series 5: coronal · coronal · 0.59mm/px · 3 of 87 slices shown]
[im 18/87  lung]
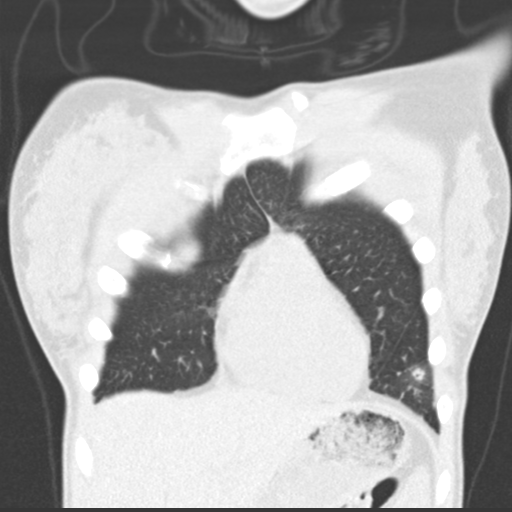
[im 35/87  lung]
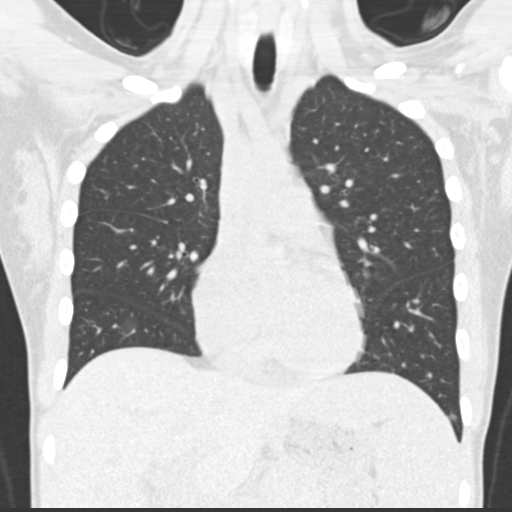
[im 52/87  lung]
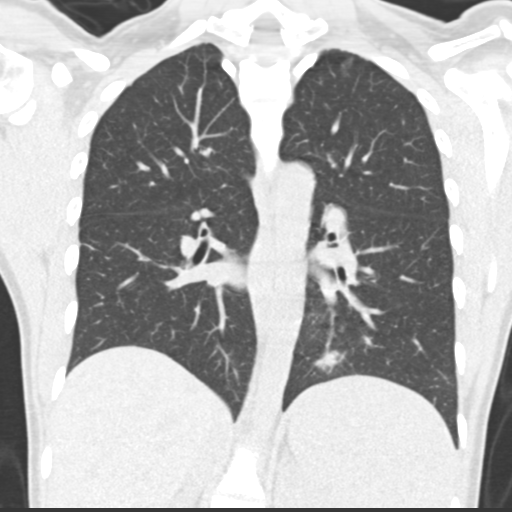

[15 of 36 positions shown; findings below may reference images not displayed]

FINDINGS: Cardiovascular: Unremarkable

Mediastinum/Nodes: Unremarkable

Lungs/Pleura: A dominant left lower lobe nodule measures 1.4 by
cm on image 109 of series 3, previously 1.4 by 1.0 cm on 10/12/2021.

Bilateral scattered ground-glass density nodules are present in both
lungs. Some are new, some have resolved, and some demonstrate
increase or decrease in prominence. For example, the 0.8 by 0.6 cm
ground-glass density in the right lower lobe on image 85 of series 3
is new compared to 10/12/2021.

A ground-glass density nodule previously shown peripherally in the
anterior right upper lobe on image 57 of series 3 of the prior exam
has resolved. The 1.2 by 1.5 cm ground-glass density nodule
anteriorly in the right lower lobe on image 79 of series 3 is
increased in solidity and previously measured 1.0 by 0.9 cm.
Multiple additional ground-glass density nodules are present.

Upper Abdomen: Prominent upper margin of the spleen raising
suspicion for potential splenomegaly.

Musculoskeletal: Unremarkable
IMPRESSION: 1. Stable dominant solid nodule in the left lower lobe measuring
by 1.0 cm on image 109 series [DATE]. Waxing and waning ground-glass density nodules in both lungs,
including resolution of some prior nodules, some new nodules, some
nodules remaining stable, and some nodules demonstrating increase or
decrease in size/prominence. This appearance of the ground-glass
density nodules would tend to favor a inflammatory or infectious
process particularly in light of the patient's age.
3. Prominent upper margin of the spleen raising suspicion for
potential splenomegaly.

## 2022-10-08 IMAGING — DX DG CHEST 2V
3 series · 3 of 3 positions shown · non-contrast
Comparison: 11/16/2021, 06/10/2021

CLINICAL DATA: 20-year-old female with hemoptysis

EXAM:
CHEST - 2 VIEW

[chest pa (1 of 2)]
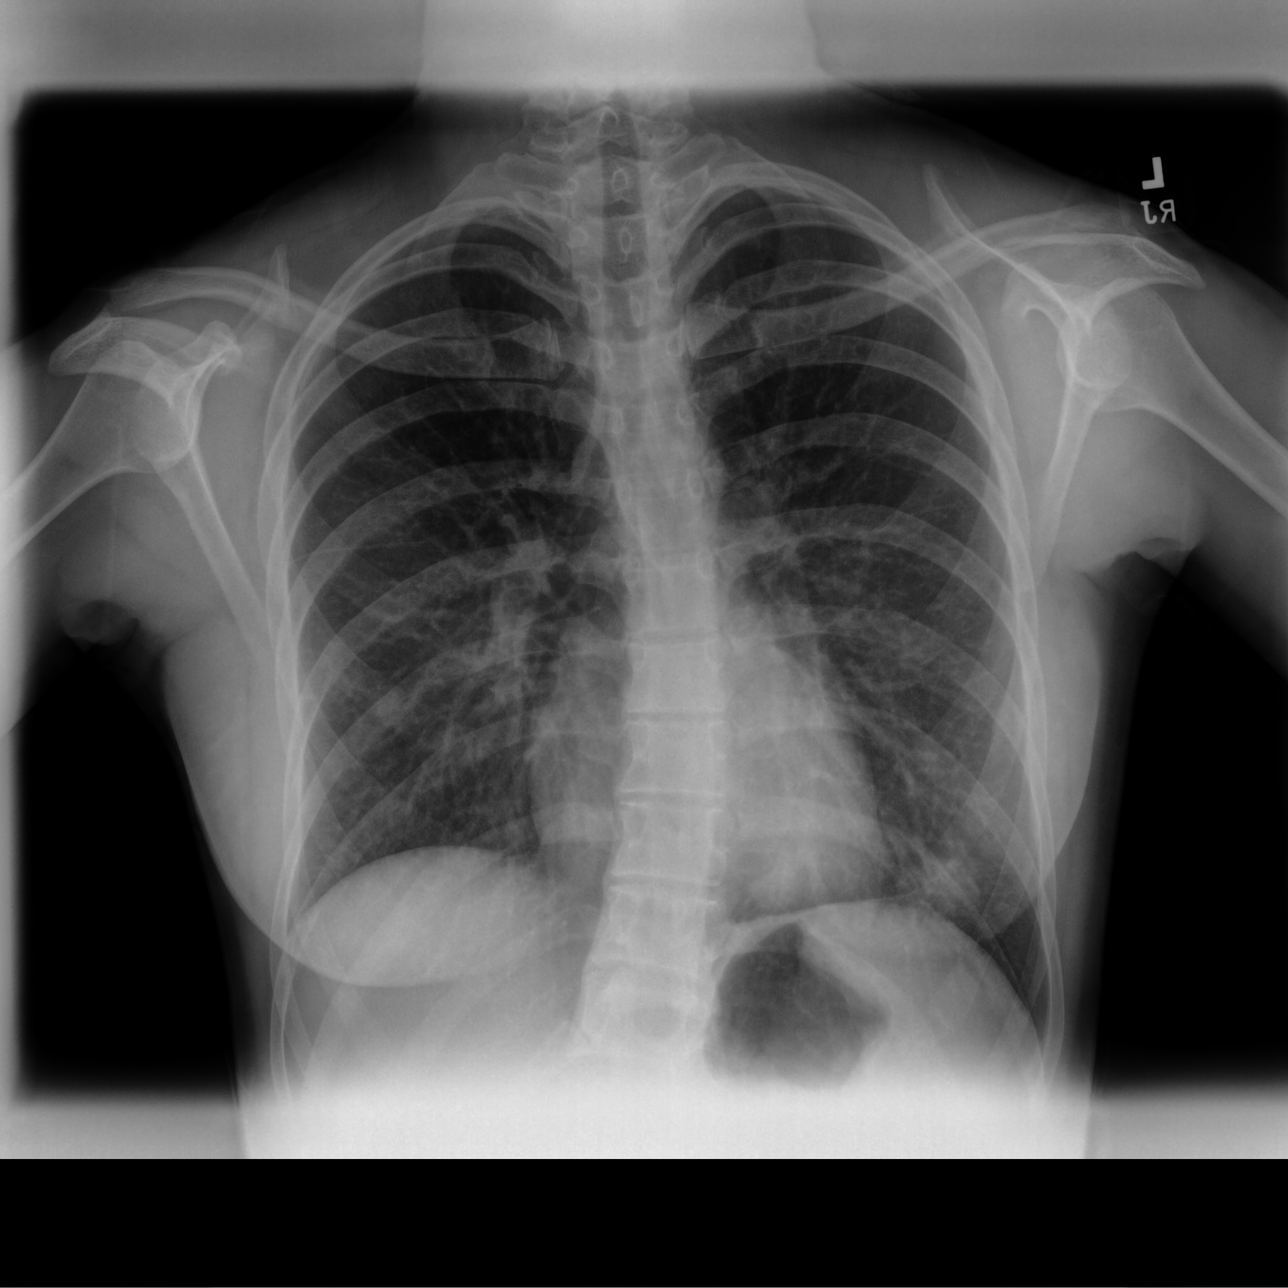

[chest pa (2 of 2)]
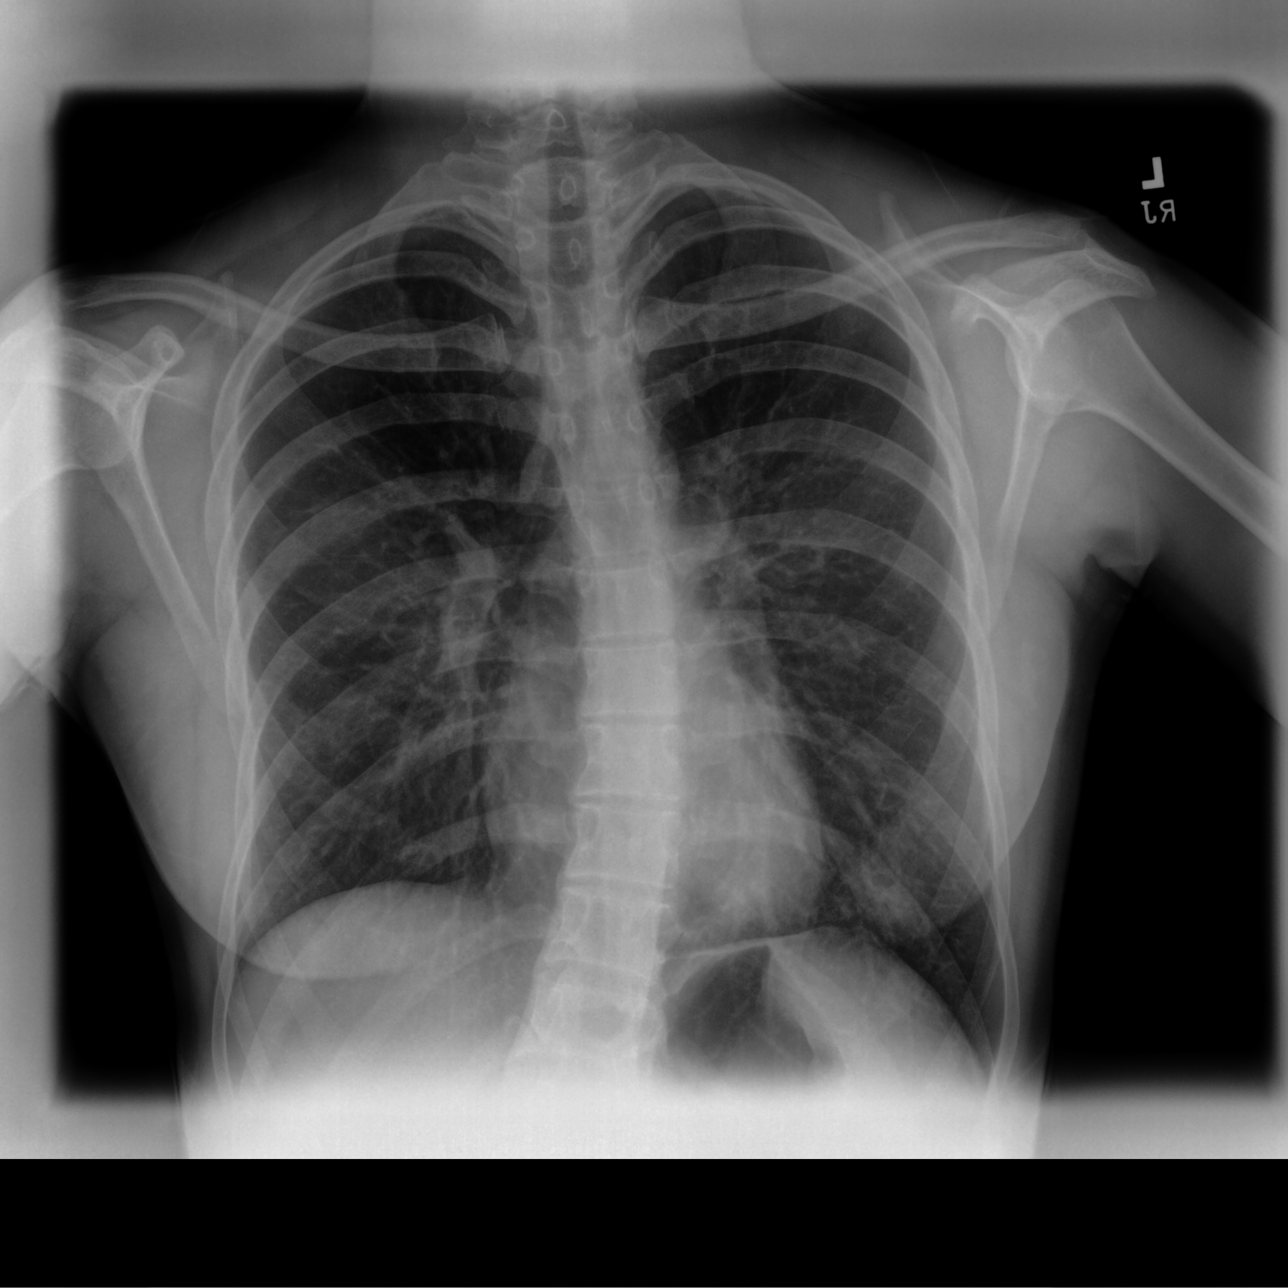

[chest lat]
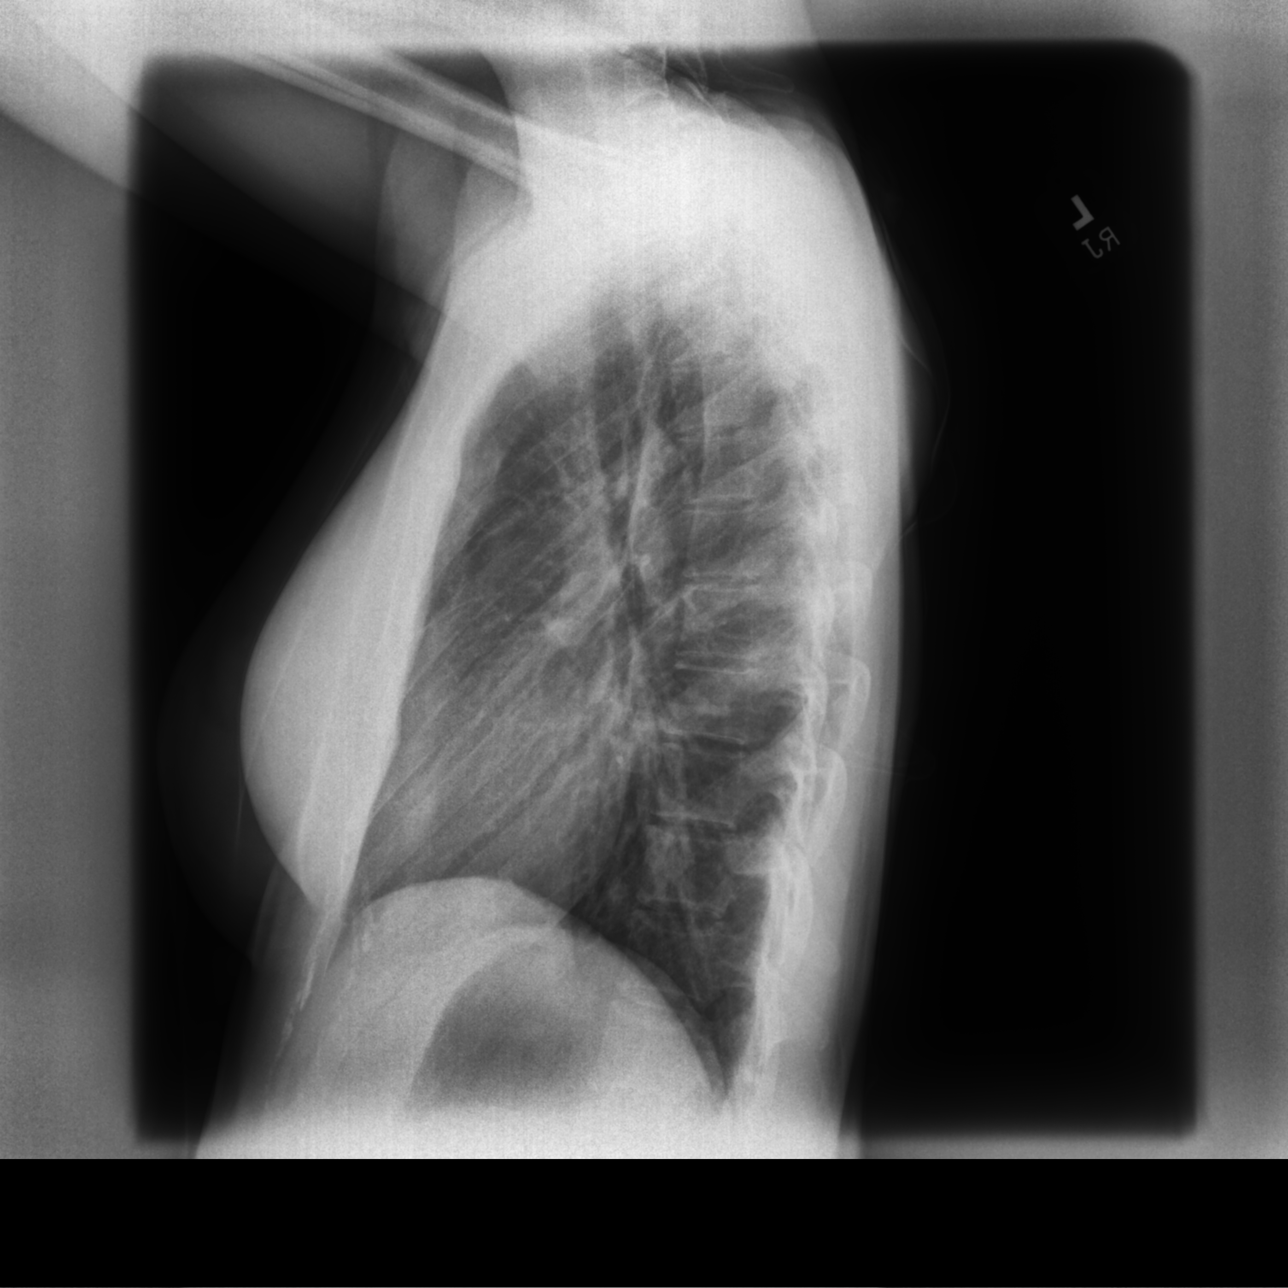

[3 of 3 positions shown; findings below may reference images not displayed]

FINDINGS: Cardiomediastinal silhouette unchanged in size and contour.

New patchy airspace opacities at the left lung base. No pneumothorax
or pleural effusion.

Scoliotic curvature again demonstrated.  No displaced fracture.
IMPRESSION: Patchy airspace opacities at the left lung base, potentially
representing infection versus consolidation as sequela of
hemoptysis.

## 2022-12-01 ENCOUNTER — Ambulatory Visit (INDEPENDENT_AMBULATORY_CARE_PROVIDER_SITE_OTHER): Payer: Managed Care, Other (non HMO) | Admitting: Family Medicine

## 2022-12-01 ENCOUNTER — Encounter: Payer: Self-pay | Admitting: Family Medicine

## 2022-12-01 VITALS — BP 116/75 | HR 101 | Temp 98.8°F | Resp 16 | Ht 63.0 in | Wt 113.6 lb

## 2022-12-01 DIAGNOSIS — R058 Other specified cough: Secondary | ICD-10-CM | POA: Insufficient documentation

## 2022-12-01 MED ORDER — BENZONATATE 100 MG PO CAPS: 100.0000 mg | ORAL_CAPSULE | Freq: Two times a day (BID) | ORAL | 0 refills | Status: DC | PRN

## 2022-12-01 NOTE — Assessment & Plan Note (Signed)
Acute  Improved  No SOB, no chest pain  Suspect patient is recovering from viral illness Recommended continued oral hydration, rest from exercising until fully recovered, drinking warm teas Prescribed tessalon perles 100mg  bid PRN for cough  Counseled patient to return to care if she experiences frequent palpitations (HR was 101 today, patient states she normally has HR elevation in the office but resting rate after running is in 60s-70s), acute worsening of URI symptoms or if she feels lightheaded/dizzy

## 2022-12-01 NOTE — Progress Notes (Signed)
I,[name],acting as a scribe for [employer], MD.,have documented all relevant documentation on the behalf of Ronnald Ramp, MD,as directed by  Ronnald Ramp, MD while in the presence of Ronnald Ramp, MD.   Established patient visit   Patient: Destiny Phillips   DOB: Sep 14, 2000   21 y.o. Female  MRN: [id number] Visit Date: 12/01/2022  Today's healthcare provider: Ronnald Ramp, MD   Chief Complaint  Patient presents with   URI   Subjective    HPI  Upper respiratory symptoms She complains of cough described as productive of yellow sputum.with no fever, chills, night sweats or weight loss. Onset of symptoms was about a week ago and gradually improving.She is drinking moderate amounts of fluids.  Past history is significant for pneumonia. Patient is non-smoker. She has tried mucinex and nyquil day and night.  ---------------------------------------------------------------------------------------------------   Medications: Outpatient Medications Prior to Visit  Medication Sig   Bacillus Coagulans-Inulin (ALIGN PREBIOTIC-PROBIOTIC) 5-1.25 MG-GM CHEW Chew by mouth.   METHYLCOBALAMIN PO Take 1 mg by mouth. 2-3 capsules a day   verapamil (CALAN-SR) 120 MG CR tablet Take 1 tablet (120 mg total) by mouth daily as needed (for complex migraine).   No facility-administered medications prior to visit.    Review of Systems     Objective    BP 116/75 (BP Location: Left Arm, Patient Position: Sitting, Cuff Size: Normal)   Pulse (!) 101   Temp 98.8 F (37.1 C) (Oral)   Resp 16   Ht 5\' 3"  (1.6 m)   Wt 113 lb 9.6 oz (51.5 kg)   BMI 20.12 kg/m    Physical Exam Vitals reviewed.  Constitutional:      General: She is not in acute distress.    Appearance: Normal appearance. She is not ill-appearing, toxic-appearing or diaphoretic.  Eyes:     Conjunctiva/sclera: Conjunctivae normal.  Cardiovascular:     Rate and Rhythm:  Normal rate and regular rhythm.     Pulses: Normal pulses.     Heart sounds: Normal heart sounds. No murmur heard.    No friction rub. No gallop.  Pulmonary:     Effort: Pulmonary effort is normal. No accessory muscle usage, prolonged expiration, respiratory distress or retractions.     Breath sounds: Normal breath sounds. No stridor, decreased air movement or transmitted upper airway sounds. No decreased breath sounds, wheezing, rhonchi or rales.  Abdominal:     General: Bowel sounds are normal. There is no distension.     Palpations: Abdomen is soft.     Tenderness: There is no abdominal tenderness.  Musculoskeletal:     Right lower leg: No edema.     Left lower leg: No edema.  Skin:    Findings: No erythema or rash.  Neurological:     Mental Status: She is alert and oriented to person, place, and time.       No results found for any visits on 12/01/22.  Assessment & Plan     Problem List Items Addressed This Visit       Other   Cough productive of yellow sputum - Primary    Acute  Improved  No SOB, no chest pain  Suspect patient is recovering from viral illness Recommended continued oral hydration, rest from exercising until fully recovered, drinking warm teas Prescribed tessalon perles 100mg  bid PRN for cough  Counseled patient to return to care if she experiences frequent palpitations (HR was 101 today, patient states she normally has HR elevation  in the office but resting rate after running is in 60s-70s), acute worsening of URI symptoms or if she feels lightheaded/dizzy       Relevant Medications   benzonatate (TESSALON) 100 MG capsule     No follow-ups on file.        The entirety of the information documented in the History of Present Illness, Review of Systems and Physical Exam were personally obtained by me. Portions of this information were initially documented by Hetty Ely, CMA . I, Ronnald Ramp, MD have reviewed the documentation above  for thoroughness and accuracy.      Ronnald Ramp, MD  [facility] [phone] (phone) [phone] (fax)  [facility]

## 2022-12-03 ENCOUNTER — Encounter: Payer: Self-pay | Admitting: Family Medicine

## 2022-12-03 ENCOUNTER — Other Ambulatory Visit: Payer: Self-pay | Admitting: Family Medicine

## 2022-12-03 DIAGNOSIS — R058 Other specified cough: Secondary | ICD-10-CM

## 2022-12-07 ENCOUNTER — Ambulatory Visit
Admission: RE | Admit: 2022-12-07 | Discharge: 2022-12-07 | Disposition: A | Discharge status: Home / Self Care | Payer: Managed Care, Other (non HMO) | Attending: Family Medicine | Admitting: Family Medicine

## 2022-12-07 ENCOUNTER — Ambulatory Visit
Admission: RE | Admit: 2022-12-07 | Discharge: 2022-12-07 | Disposition: A | Discharge status: Home / Self Care | Payer: Managed Care, Other (non HMO) | Source: Ambulatory Visit | Attending: Family Medicine

## 2022-12-07 DIAGNOSIS — R058 Other specified cough: Secondary | ICD-10-CM

## 2022-12-29 ENCOUNTER — Ambulatory Visit: Payer: Managed Care, Other (non HMO) | Admitting: Family Medicine

## 2023-01-17 ENCOUNTER — Encounter: Payer: Self-pay | Admitting: Family Medicine

## 2023-01-17 ENCOUNTER — Ambulatory Visit (INDEPENDENT_AMBULATORY_CARE_PROVIDER_SITE_OTHER): Payer: Managed Care, Other (non HMO) | Admitting: Family Medicine

## 2023-01-17 VITALS — BP 106/73 | HR 96 | Resp 15 | Ht 63.0 in | Wt 108.3 lb

## 2023-01-17 DIAGNOSIS — R399 Unspecified symptoms and signs involving the genitourinary system: Secondary | ICD-10-CM | POA: Diagnosis not present

## 2023-01-17 DIAGNOSIS — F5105 Insomnia due to other mental disorder: Secondary | ICD-10-CM | POA: Diagnosis not present

## 2023-01-17 DIAGNOSIS — F419 Anxiety disorder, unspecified: Secondary | ICD-10-CM

## 2023-01-17 DIAGNOSIS — R634 Abnormal weight loss: Secondary | ICD-10-CM | POA: Diagnosis not present

## 2023-01-17 LAB — POCT URINALYSIS DIPSTICK
Bilirubin, UA: NEGATIVE
Blood, UA: POSITIVE
Glucose, UA: NEGATIVE
Ketones, UA: NEGATIVE
Nitrite, UA: POSITIVE
Protein, UA: POSITIVE — AB
Spec Grav, UA: 1.02 (ref 1.010–1.025)
Urobilinogen, UA: 0.2 E.U./dL — AB
pH, UA: 6 (ref 5.0–8.0)

## 2023-01-17 MED ORDER — HYDROXYZINE PAMOATE 25 MG PO CAPS: 25.0000 mg | ORAL_CAPSULE | Freq: Three times a day (TID) | ORAL | 0 refills | Status: DC | PRN

## 2023-01-17 MED ORDER — ESCITALOPRAM OXALATE 10 MG PO TABS: 10.0000 mg | ORAL_TABLET | Freq: Every day | ORAL | 0 refills | Status: DC

## 2023-01-17 MED ORDER — CEPHALEXIN 500 MG PO CAPS: 500.0000 mg | ORAL_CAPSULE | Freq: Three times a day (TID) | ORAL | 0 refills | Status: AC

## 2023-01-17 NOTE — Assessment & Plan Note (Addendum)
Symptoms of urgency, frequency, and burning. Positive leukocytes on home test. Urinalysis shows blood, protein, and leukocytes. -Start Keflex 500mg  TID for 7 days. -Order urine culture and microscopy  -Reports 4-5 UTIs since August. Possible association with sexual activity. -Refer to Urology for further evaluation.

## 2023-01-17 NOTE — Assessment & Plan Note (Signed)
Reports daily symptoms of nervousness, uncontrollable worrying, restlessness, irritability, and fear. Difficulty sleeping and unintentional weight loss. Currently in therapy. -Start Lexapro 10mg  daily. -Start Hydroxyzine 25mg  every 8 hours as needed for increased anxiety and at bedtime for insomnia. -Schedule follow-up in 2-3 weeks.

## 2023-01-17 NOTE — Assessment & Plan Note (Signed)
Lost 7 pounds since March. Not currently training or eating as much as before. BMI 19. -Order CMP, TSH, CBC, Phosphorus, and Hemoglobin A1c to investigate cause.

## 2023-01-17 NOTE — Progress Notes (Signed)
I,Vanessa  Vital,acting as a Neurosurgeon for [employer], MD.,have documented all relevant documentation on the behalf of Ronnald Ramp, MD,as directed by  Ronnald Ramp, MD while in the presence of Ronnald Ramp, MD.   Established patient visit   Patient: Destiny Phillips   DOB: [birth year]   22 y.o. Female  MRN: [id number] Visit Date: 01/17/2023  Today's healthcare provider: Ronnald Ramp, MD   Chief Complaint  Patient presents with   Anxiety   Depression   Subjective      Discussed the use of AI scribe software for clinical note transcription with the patient, who gave verbal consent to proceed.   Dysuria and Urinary Frequency  The patient presents with symptoms suggestive of a urinary tract infection (UTI), including urinary urgency, frequency, and discomfort, as well as a positive home test for leukocytes. She reports a pattern of recurrent UTIs, with four to five episodes since August, often following visits with their partner.   Anxiety & Insomnia In addition to the urinary symptoms, the patient has been experiencing significant anxiety and panic attacks. She has been in therapy for two years, but recently, the anxiety has escalated to the point of causing sleep disturbances and unintentional weight loss due to decreased appetite. The patient reports a history of anxiety attacks requiring hospital visits, with a notable increase in symptoms during the month of May. This pattern has been consistent for the past seven years, since a traumatic health event involving a severe menstrual period and low platelet count.   Unintentional Weight Loss  The patient also reports a history of body image issues and has avoided tracking their weight. However, recent unintentional weight loss has raised concerns. She has not been engaging in their usual physical activities or maintaining their typical diet due to decreased appetite.  The patient is  about to start a new job, which may be contributing to her current level of anxiety. Despite these challenges, she reports being able to function at work and maintain social interactions.          01/17/2023   10:46 AM  GAD 7 : Generalized Anxiety Score  Nervous, Anxious, on Edge 3  Control/stop worrying 3  Worry too much - different things 3  Trouble relaxing 3  Restless 2  Easily annoyed or irritable 2  Afraid - awful might happen 3  Total GAD 7 Score 19  Anxiety Difficulty Very difficult      Medications: Outpatient Medications Prior to Visit  Medication Sig   Bacillus Coagulans-Inulin (ALIGN PREBIOTIC-PROBIOTIC) 5-1.25 MG-GM CHEW Chew by mouth.   METHYLCOBALAMIN PO Take 1 mg by mouth. 2-3 capsules a day   benzonatate (TESSALON) 100 MG capsule Take 1 capsule (100 mg total) by mouth 2 (two) times daily as needed for cough. (Patient not taking: Reported on 01/17/2023)   verapamil (CALAN-SR) 120 MG CR tablet Take 1 tablet (120 mg total) by mouth daily as needed (for complex migraine). (Patient not taking: Reported on 01/17/2023)   No facility-administered medications prior to visit.    Review of Systems  Constitutional:  Positive for unexpected weight change.  Psychiatric/Behavioral:  Positive for sleep disturbance.   All other systems reviewed and are negative.      Objective    BP 106/73 (BP Location: Left Arm, Patient Position: Sitting, Cuff Size: Normal)   Pulse 96   Resp 15   Ht 5\' 3"  (1.6 m)   Wt 108 lb 4.8 oz (49.1 kg)   SpO2 100%  BMI 19.18 kg/m    Physical Exam  Physical Exam   MEASUREMENTS: BMI- 19 GENERAL: Non tender and non distended abdomen. NECK: No thyromegaly or thyroid tenderness or thyroid nodules palpable. CHEST: Clear lungs bilaterally without wheezing or rhonchi. CARDIOVASCULAR: Regular rate and rhythm without murmurs.       Results for orders placed or performed in visit on 01/17/23  POCT urinalysis dipstick  Result Value Ref Range    Color, UA Yellow    Clarity, UA     Glucose, UA Negative Negative   Bilirubin, UA Negative    Ketones, UA Negative    Spec Grav, UA 1.020 1.010 - 1.025   Blood, UA Positive    pH, UA 6.0 5.0 - 8.0   Protein, UA Positive (A) Negative   Urobilinogen, UA 0.2 (A) 0.2 or 1.0 E.U./dL   Nitrite, UA Positive    Leukocytes, UA Moderate (2+) (A) Negative   Appearance Cloudy    Odor      Assessment & Plan     Problem List Items Addressed This Visit     Insomnia due to mental disorder    Acute problem  Likely associated with anxiety  Recommended hydroxyzine 25mg  PRN at bedtime       Relevant Medications   hydrOXYzine (VISTARIL) 25 MG capsule   Other Relevant Orders   TSH + free T4   CBC   Anxiety    Reports daily symptoms of nervousness, uncontrollable worrying, restlessness, irritability, and fear. Difficulty sleeping and unintentional weight loss. Currently in therapy. -Start Lexapro 10mg  daily. -Start Hydroxyzine 25mg  every 8 hours as needed for increased anxiety and at bedtime for insomnia. -Schedule follow-up in 2-3 weeks.      Relevant Medications   escitalopram (LEXAPRO) 10 MG tablet   hydrOXYzine (VISTARIL) 25 MG capsule   Other Relevant Orders   Comprehensive metabolic panel   TSH + free T4   CBC   Urinary tract infection symptoms - Primary     Symptoms of urgency, frequency, and burning. Positive leukocytes on home test. Urinalysis shows blood, protein, and leukocytes. -Start Keflex 500mg  TID for 7 days. -Order urine culture and microscopy  -Reports 4-5 UTIs since August. Possible association with sexual activity. -Refer to Urology for further evaluation.       Relevant Orders   Urine Culture   Urinalysis, microscopic only   Ambulatory referral to Urology   POCT urinalysis dipstick (Completed)   Unintentional weight loss    Lost 7 pounds since March. Not currently training or eating as much as before. BMI 19. -Order CMP, TSH, CBC, Phosphorus, and Hemoglobin  A1c to investigate cause.          Relevant Orders   Comprehensive metabolic panel   TSH + free T4   CBC   Hemoglobin A1c   Phosphorus     Return in about 2 weeks (around 01/31/2023) for Mood.         The entirety of the information documented in the History of Present Illness, Review of Systems and Physical Exam were personally obtained by me. Portions of this information were initially documented by Lubertha Basque, CMA . I, Ronnald Ramp, MD have reviewed the documentation above for thoroughness and accuracy.    Ronnald Ramp, MD  [facility] [phone] (phone) [phone] (fax)  [facility] [place] [facility]

## 2023-01-17 NOTE — Assessment & Plan Note (Signed)
Acute problem  Likely associated with anxiety  Recommended hydroxyzine 25mg  PRN at bedtime

## 2023-01-17 NOTE — Patient Instructions (Addendum)
Please start taking the prescribed medications as directed for anxiety, sleep as well as UTI symptoms.  -Lexapro 10mg  once daily and Hydroxyzine 25mg  up to three times daily as needed   -Keflex 500mg  three times daily for 7 days for UTI symptoms   Make sure to schedule your follow-up visit in 2-3 weeks   A referral has been placed on your behalf for urology. Our referral coordination team or the office you will be visiting will contact you within the next 2 weeks.  If you have not received a phone call within 10 business days please let us know so that we can check into this for you.   Once the results of your urine and blood tests are available, we will contact you to discuss the findings.

## 2023-01-18 LAB — COMPREHENSIVE METABOLIC PANEL
ALT: 19 IU/L (ref 0–32)
AST: 20 IU/L (ref 0–40)
Albumin: 4.7 g/dL (ref 4.0–5.0)
Alkaline Phosphatase: 89 IU/L (ref 44–121)
BUN/Creatinine Ratio: 13 (ref 9–23)
BUN: 11 mg/dL (ref 6–20)
Bilirubin Total: 0.4 mg/dL (ref 0.0–1.2)
CO2: 22 mmol/L (ref 20–29)
Calcium: 9.7 mg/dL (ref 8.7–10.2)
Chloride: 103 mmol/L (ref 96–106)
Creatinine, Ser: 0.87 mg/dL (ref 0.57–1.00)
Globulin, Total: 1.5 g/dL (ref 1.5–4.5)
Glucose: 94 mg/dL (ref 70–99)
Potassium: 5 mmol/L (ref 3.5–5.2)
Sodium: 141 mmol/L (ref 134–144)
Total Protein: 6.2 g/dL (ref 6.0–8.5)
eGFR: 97 mL/min/{1.73_m2} (ref >59)

## 2023-01-18 LAB — CBC
Hematocrit: 38.9 % (ref 34.0–46.6)
Hemoglobin: 12.4 g/dL (ref 11.1–15.9)
MCH: 25.3 pg — ABNORMAL LOW (ref 26.6–33.0)
MCHC: 31.9 g/dL (ref 31.5–35.7)
MCV: 79 fL (ref 79–97)
Platelets: 105 10*3/uL — ABNORMAL LOW (ref 150–450)
RBC: 4.91 x10E6/uL (ref 3.77–5.28)
RDW: 13.1 % (ref 11.7–15.4)
WBC: 5 10*3/uL (ref 3.4–10.8)

## 2023-01-18 LAB — URINALYSIS, MICROSCOPIC ONLY
Casts: NONE SEEN /lpf
WBC, UA: >30 /hpf — AB (ref 0–5)

## 2023-01-18 LAB — HEMOGLOBIN A1C
Est. average glucose Bld gHb Est-mCnc: 100 mg/dL
Hgb A1c MFr Bld: 5.1 % (ref 4.8–5.6)

## 2023-01-18 LAB — TSH+FREE T4
Free T4: 1.31 ng/dL (ref 0.82–1.77)
TSH: 0.577 u[IU]/mL (ref 0.450–4.500)

## 2023-01-18 LAB — PHOSPHORUS: Phosphorus: 3.9 mg/dL (ref 3.0–4.3)

## 2023-01-20 LAB — URINE CULTURE

## 2023-01-21 ENCOUNTER — Other Ambulatory Visit: Payer: Managed Care, Other (non HMO)

## 2023-01-21 ENCOUNTER — Ambulatory Visit: Payer: Managed Care, Other (non HMO) | Admitting: Oncology

## 2023-01-26 NOTE — Telephone Encounter (Signed)
Message was routed to me by Dr. Thora Lance. Routing to triage for them to handle.

## 2023-02-04 ENCOUNTER — Other Ambulatory Visit: Payer: Managed Care, Other (non HMO)

## 2023-02-04 ENCOUNTER — Ambulatory Visit: Payer: Managed Care, Other (non HMO) | Admitting: Oncology

## 2023-04-11 ENCOUNTER — Inpatient Hospital Stay: Payer: Managed Care, Other (non HMO)

## 2023-04-11 ENCOUNTER — Inpatient Hospital Stay: Payer: Managed Care, Other (non HMO) | Admitting: Oncology

## 2023-04-11 NOTE — Assessment & Plan Note (Deleted)
With recent flare of acute ITP responded to dexamethasone 40 mg daily for 4 days. Check CBC, CMP, B12, folate, flow cytometry.  It appears that ITP always respond very well to steroid treatments. Discussed about future options if recurrent ITP or refractory ITP.  These options include splenectomy, CD20 antibodies, TPO agonist.  Currently given that her ITP responses very well to steroids and no frequent recurrence.  I recommend continue observation and check blood work every 6 months.  She agrees with the plan.

## 2023-04-28 ENCOUNTER — Other Ambulatory Visit: Payer: Self-pay | Admitting: Family Medicine

## 2023-04-28 DIAGNOSIS — F419 Anxiety disorder, unspecified: Secondary | ICD-10-CM

## 2023-04-28 NOTE — Telephone Encounter (Signed)
Requested by interface surescripts.  Requested Prescriptions  Pending Prescriptions Disp Refills   escitalopram (LEXAPRO) 10 MG tablet [Pharmacy Med Name: escitalopram 10 mg tablet] 90 tablet 0    Sig: TAKE ONE TABLET BY MOUTH DAILY     Psychiatry:  Antidepressants - SSRI Passed - 04/28/2023 12:07 PM      Passed - Valid encounter within last 6 months    Recent Outpatient Visits           3 months ago Urinary tract infection symptoms   [facility] Simmons-Robinson, [place], MD   4 months ago Cough productive of yellow sputum   [facility] Simmons-Robinson, [place], [place]   7 months ago Chronic ITP (idiopathic thrombocytopenia) (HCC)   [facility] Simmons-Robinson, [place], MD   8 months ago Urinary frequency   [facility] Simmons-Robinson, [place], [place]   10 months ago Chronic ITP (idiopathic thrombocytopenia) [facility])   [facility] Alfredia Ferguson, [place]

## 2023-05-10 ENCOUNTER — Other Ambulatory Visit: Payer: Self-pay

## 2023-05-10 DIAGNOSIS — E611 Iron deficiency: Secondary | ICD-10-CM

## 2023-05-11 ENCOUNTER — Encounter: Payer: Self-pay | Admitting: Oncology

## 2023-05-11 ENCOUNTER — Inpatient Hospital Stay: Payer: Managed Care, Other (non HMO) | Admitting: Oncology

## 2023-05-11 ENCOUNTER — Inpatient Hospital Stay: Payer: Managed Care, Other (non HMO) | Attending: Oncology

## 2023-05-11 NOTE — Assessment & Plan Note (Deleted)
With recent flare of acute ITP responded to dexamethasone 40 mg daily for 4 days. Check CBC, CMP, B12, folate, flow cytometry.  It appears that ITP always respond very well to steroid treatments. Discussed about future options if recurrent ITP or refractory ITP.  These options include splenectomy, CD20 antibodies, TPO agonist.  Currently given that her ITP responses very well to steroids and no frequent recurrence.  I recommend continue observation and check blood work every 6 months.  She agrees with the plan.

## 2023-06-29 NOTE — Progress Notes (Unsigned)
      Established patient visit   Patient: Destiny Phillips   DOB: [birth year]   22 y.o. Female  MRN: [id number] Visit Date: 06/30/2023  Today's healthcare provider: Ronnald Ramp, MD   No chief complaint on file.  Subjective       Discussed the use of AI scribe software for clinical note transcription with the patient, who gave verbal consent to proceed.  History of Present Illness             Past Medical History:  Diagnosis Date   Idiopathic thrombocytopenic purpura (ITP) (HCC)    SVT (supraventricular tachycardia)     Medications: Outpatient Medications Prior to Visit  Medication Sig   Bacillus Coagulans-Inulin (ALIGN PREBIOTIC-PROBIOTIC) 5-1.25 MG-GM CHEW Chew by mouth.   benzonatate (TESSALON) 100 MG capsule Take 1 capsule (100 mg total) by mouth 2 (two) times daily as needed for cough. (Patient not taking: Reported on 01/17/2023)   escitalopram (LEXAPRO) 10 MG tablet TAKE ONE TABLET BY MOUTH DAILY   hydrOXYzine (VISTARIL) 25 MG capsule Take 1 capsule (25 mg total) by mouth every 8 (eight) hours as needed for anxiety.   METHYLCOBALAMIN PO Take 1 mg by mouth. 2-3 capsules a day   verapamil (CALAN-SR) 120 MG CR tablet Take 1 tablet (120 mg total) by mouth daily as needed (for complex migraine). (Patient not taking: Reported on 01/17/2023)   No facility-administered medications prior to visit.    Review of Systems  {Insert previous labs (optional):23779} {See past labs  Heme  Chem  Endocrine  Serology  Results Review (optional):1}   Objective    There were no vitals taken for this visit. {Insert last BP/Wt (optional):23777}{See vitals history (optional):1}    Physical Exam  ***  No results found for any visits on 06/30/23.  Assessment & Plan     Problem List Items Addressed This Visit       Other   Urinary frequency   Other Visit Diagnoses     Viral upper respiratory tract infection    -  Primary       Assessment and Plan               No follow-ups on file.         Ronnald Ramp, MD  [facility] [phone] (phone) [phone] (fax)  [facility]

## 2023-06-30 ENCOUNTER — Other Ambulatory Visit: Payer: Self-pay | Admitting: Family Medicine

## 2023-06-30 ENCOUNTER — Encounter: Payer: Self-pay | Admitting: Family Medicine

## 2023-06-30 ENCOUNTER — Ambulatory Visit (INDEPENDENT_AMBULATORY_CARE_PROVIDER_SITE_OTHER): Payer: Managed Care, Other (non HMO) | Admitting: Family Medicine

## 2023-06-30 VITALS — BP 116/82 | HR 111 | Resp 16 | Ht 63.0 in | Wt 118.0 lb

## 2023-06-30 DIAGNOSIS — J069 Acute upper respiratory infection, unspecified: Secondary | ICD-10-CM

## 2023-06-30 DIAGNOSIS — R399 Unspecified symptoms and signs involving the genitourinary system: Secondary | ICD-10-CM

## 2023-06-30 DIAGNOSIS — D693 Immune thrombocytopenic purpura: Secondary | ICD-10-CM

## 2023-06-30 DIAGNOSIS — R350 Frequency of micturition: Secondary | ICD-10-CM | POA: Diagnosis not present

## 2023-06-30 DIAGNOSIS — R300 Dysuria: Secondary | ICD-10-CM

## 2023-06-30 LAB — POCT URINALYSIS DIPSTICK
Bilirubin, UA: NEGATIVE
Glucose, UA: NEGATIVE
Ketones, UA: NEGATIVE
Nitrite, UA: NEGATIVE
Protein, UA: NEGATIVE
Spec Grav, UA: 1.025 (ref 1.010–1.025)
Urobilinogen, UA: 0.2 U/dL
pH, UA: 6 (ref 5.0–8.0)

## 2023-06-30 MED ORDER — CEPHALEXIN 500 MG PO CAPS
500.0000 mg | ORAL_CAPSULE | Freq: Three times a day (TID) | ORAL | 0 refills | Status: AC
Start: 2023-06-30 — End: 2023-07-07

## 2023-06-30 NOTE — Assessment & Plan Note (Signed)
Recurrent urinary symptoms including strong odor and frequency, particularly post-coital. UA shows WBCs, hematuria (likely due to recent menses), and ketones. No nitrites. Previous E. coli culture in July 2024. No abdominal pain, back pain, or dysuria. Patient has not seen a urologist due to a busy work schedule. Discussed avoiding prophylactic antibiotics to prevent resistance. Patient prefers not to use prophylactic antibiotics due to concerns about resistance. Keflex remains effective against the infection after review of previous urine culture sensitivity report from July 2024 in EMR. - Prescribe Keflex 500 mg TID for 7 days - Send a new referral to urology

## 2023-06-30 NOTE — Assessment & Plan Note (Signed)
Patient expressed concern about urinary symptoms potentially related to diabetes. Discussed overlap of symptoms such as frequency and odor with diabetes. Patient requested HbA1c and CBC tests for further evaluation. - Order HbA1c test - Order CBC to check platelet levels

## 2023-06-30 NOTE — Patient Instructions (Addendum)
It was a pleasure to see you today!  Thank you for choosing [facility] for your primary care.   VISIT SUMMARY:  Today, you were seen for urinary and cold symptoms. Your cold symptoms, including congestion, headaches, sinus pain, and sore throat, have been ongoing for a week but are gradually improving. You also reported urinary symptoms such as a strong odor and increased frequency, particularly after sexual intercourse. We discussed your concerns about potential diabetes and your history of E. coli infection and lung nodules.  YOUR PLAN:  -URINARY TRACT INFECTION (UTI): A UTI is an infection in any part of your urinary system. You have recurrent urinary symptoms, including a strong odor and increased frequency, especially after sexual intercourse. We will treat this with Keflex 500 mg three times a day for 7 days. A new referral to urology has been sent for further evaluation.  -UPPER RESPIRATORY INFECTION (URI): An upper respiratory infection is a viral infection that affects your nose, throat, and airways. Your cold symptoms are gradually improving. We recommend using Flonase or Astepro for congestion and Benadryl at night for symptom relief. A chest x-ray has been ordered to rule out any other lung issues.  Xray:  [address], [place], [place] [address]  -DIABETES SCREENING: Diabetes is a condition that affects how your body processes blood sugar. Due to your concerns about urinary symptoms potentially related to diabetes, we have ordered an HbA1c test and a CBC to check your platelet levels.  INSTRUCTIONS:  Please complete the chest x-ray at [facility] as soon as possible. We will send your x-ray results via MyChart and call you if further action is needed. Follow up with the urologist as soon as you can. Continue taking Keflex as prescribed and use Flonase or Astepro for congestion. If you have any questions or concerns, please contact our office.  To keep you  healthy, please keep in mind the following health maintenance items that you are due for:   Influenza vaccine  Consider Pneumococcal vaccine  HPV Vaccine  Pap smear (cervical cancer screening)   Best Wishes,   Dr. Roxan Hockey

## 2023-06-30 NOTE — Assessment & Plan Note (Signed)
Chronic  Stable  No exam findings today concerning for worsening low platelets  Managed with intermittent steroid courses  Last plt count 101  Repeat CBC today per patient request

## 2023-07-01 LAB — CBC
Hematocrit: 42.9 % (ref 34.0–46.6)
Hemoglobin: 13.8 g/dL (ref 11.1–15.9)
MCH: 26 pg — ABNORMAL LOW (ref 26.6–33.0)
MCHC: 32.2 g/dL (ref 31.5–35.7)
MCV: 81 fL (ref 79–97)
Platelets: 112 10*3/uL — ABNORMAL LOW (ref 150–450)
RBC: 5.31 x10E6/uL — ABNORMAL HIGH (ref 3.77–5.28)
RDW: 12.9 % (ref 11.7–15.4)
WBC: 5.8 10*3/uL (ref 3.4–10.8)

## 2023-07-01 LAB — HEMOGLOBIN A1C
Est. average glucose Bld gHb Est-mCnc: 105 mg/dL
Hgb A1c MFr Bld: 5.3 % (ref 4.8–5.6)

## 2023-07-06 LAB — URINE CULTURE

## 2023-07-06 LAB — SPECIMEN STATUS REPORT

## 2023-07-26 ENCOUNTER — Other Ambulatory Visit: Payer: Self-pay

## 2023-07-26 DIAGNOSIS — R399 Unspecified symptoms and signs involving the genitourinary system: Secondary | ICD-10-CM

## 2023-07-27 ENCOUNTER — Encounter: Payer: Self-pay | Admitting: Urology

## 2023-07-27 ENCOUNTER — Other Ambulatory Visit
Admission: RE | Admit: 2023-07-27 | Discharge: 2023-07-27 | Disposition: A | Discharge status: Home / Self Care | Payer: BC Managed Care – PPO | Attending: Urology | Admitting: Urology

## 2023-07-27 ENCOUNTER — Ambulatory Visit: Payer: BC Managed Care – PPO | Admitting: Urology

## 2023-07-27 VITALS — BP 124/79 | HR 96 | Ht 63.0 in | Wt 112.8 lb

## 2023-07-27 DIAGNOSIS — Z09 Encounter for follow-up examination after completed treatment for conditions other than malignant neoplasm: Secondary | ICD-10-CM

## 2023-07-27 DIAGNOSIS — R399 Unspecified symptoms and signs involving the genitourinary system: Secondary | ICD-10-CM | POA: Diagnosis not present

## 2023-07-27 DIAGNOSIS — N390 Urinary tract infection, site not specified: Secondary | ICD-10-CM

## 2023-07-27 DIAGNOSIS — Z87440 Personal history of urinary (tract) infections: Secondary | ICD-10-CM | POA: Diagnosis not present

## 2023-07-27 LAB — URINALYSIS, COMPLETE (UACMP) WITH MICROSCOPIC
Bilirubin Urine: NEGATIVE
Glucose, UA: NEGATIVE mg/dL
Hgb urine dipstick: NEGATIVE
Ketones, ur: NEGATIVE mg/dL
Nitrite: NEGATIVE
Protein, ur: NEGATIVE mg/dL
Specific Gravity, Urine: 1.025 (ref 1.005–1.030)
pH: 5.5 (ref 5.0–8.0)

## 2023-07-27 MED ORDER — NITROFURANTOIN MONOHYD MACRO 100 MG PO CAPS: 100.0000 mg | ORAL_CAPSULE | Freq: Every day | ORAL | 0 refills | Status: DC

## 2023-07-27 NOTE — Progress Notes (Signed)
 07/27/23 9:40 AM   [name] 06-13-2001 [id number]  CC: Recurrent UTI  HPI: Healthy 23 year old female who reports recurrent UTIs over the last 16 months.  She thinks she has had at least 5 infections in the last year.  Her UTIs are typically associated with sexual activity, and symptoms are urinary urgency and dysuria.  Her symptoms completely resolve with Azo and antibiotics.  She denies any flank pain or gross hematuria.  Urinalysis today contaminated with 6-10 squamous cells, 20-50 WBC, 6-10 RBC, many bacteria, small leukocytes.  She is just finishing her menstrual cycle.  PMH: Past Medical History:  Diagnosis Date   Idiopathic thrombocytopenic purpura (ITP) (HCC)    SVT (supraventricular tachycardia) (HCC)    Urinary tract infection     Surgical History: Past Surgical History:  Procedure Laterality Date   BRONCHIAL BIOPSY  11/16/2021   Procedure: BRONCHIAL BIOPSIES;  Surgeon: Shelah Lamar RAMAN, MD;  Location[facility] ENDOSCOPY;  Service: Pulmonary;;   BRONCHIAL BRUSHINGS  11/16/2021   Procedure: BRONCHIAL BRUSHINGS;  Surgeon: Shelah Lamar RAMAN, MD;  Location: [facility] ENDOSCOPY;  Service: Pulmonary;;   BRONCHIAL NEEDLE ASPIRATION BIOPSY  11/16/2021   Procedure: BRONCHIAL NEEDLE ASPIRATION BIOPSIES;  Surgeon: Shelah Lamar RAMAN, MD;  Location: [facility] ENDOSCOPY;  Service: Pulmonary;;   BRONCHIAL WASHINGS  11/16/2021   Procedure: BRONCHIAL WASHINGS;  Surgeon: Shelah Lamar RAMAN, MD;  Location: [facility] ENDOSCOPY;  Service: Pulmonary;;   CARDIAC ELECTROPHYSIOLOGY STUDY AND ABLATION     at [facility]   VIDEO BRONCHOSCOPY WITH RADIAL ENDOBRONCHIAL ULTRASOUND  11/16/2021   Procedure: VIDEO BRONCHOSCOPY WITH RADIAL ENDOBRONCHIAL ULTRASOUND;  Surgeon: Shelah Lamar RAMAN, MD;  Location: [facility] ENDOSCOPY;  Service: Pulmonary;;    Family History: Family History  Problem Relation Age of Onset   Healthy Mother    Healthy Father    Cancer Paternal Uncle    Breast cancer Maternal Grandmother    Diabetes Maternal Grandfather     Colon cancer Paternal Grandfather     Social History:  reports that she has never smoked. She has never used smokeless tobacco. She reports that she does not drink alcohol and does not use drugs.  Physical Exam: BP 124/79 (BP Location: Left Arm, Patient Position: Sitting, Cuff Size: Normal)   Pulse 96   Ht 5' 3 (1.6 m)   Wt 112 lb 12.8 oz (51.2 kg)   SpO2 98%   BMI 19.98 kg/m    Constitutional:  Alert and oriented, No acute distress. Cardiovascular: No clubbing, cyanosis, or edema. Respiratory: Normal respiratory effort, no increased work of breathing. GI: Abdomen is soft, nontender, nondistended, no abdominal masses   Laboratory Data: Prior culture data reviewed, multiple E. coli UTIs  Pertinent Imaging: None to review  Assessment & Plan:   23 year old female who reports at least 5 UTIs associated with sexual activity over the last year.  We discussed the evaluation and treatment of patients with recurrent UTIs at length.  We specifically discussed the differences between asymptomatic bacteriuria and true urinary tract infection.  We discussed the AUA definition of recurrent UTI of at least 2 culture proven symptomatic acute cystitis episodes in a 64-month period, or 3 within a 1 year period.  We discussed the importance of culture directed antibiotic treatment, and antibiotic stewardship.  First-line therapy includes nitrofurantoin (5 days), Bactrim(3 days), or fosfomycin(3 g single dose).  Possible etiologies of recurrent infection include periurethral tissue atrophy in postmenopausal woman, constipation, sexual activity, incomplete emptying, anatomic abnormalities, and even genetic predisposition.  Finally, we discussed the role  of perineal hygiene, timed voiding, adequate hydration, topical vaginal estrogen, cranberry prophylaxis, and low-dose antibiotic prophylaxis.  We reviewed options including postcoital prophylaxis or 90-day low-dose antibiotic and she opted for the 90-day  antibiotic.  Start cranberry tablets twice daily, 90 days nitrofurantoin  prophylaxis Follow-up in 4 to 5 months symptom check with PA, consider renal/bladder ultrasound if persistent infections   Redell Burnet, MD 07/27/2023  [facility] [address] [place], [place] [address] [phone]

## 2023-10-20 ENCOUNTER — Encounter: Payer: Self-pay | Admitting: Urology

## 2023-10-31 ENCOUNTER — Ambulatory Visit: Payer: Self-pay | Admitting: Urology

## 2023-11-08 NOTE — Progress Notes (Unsigned)
 11/09/2023 10:34 AM   Destiny Phillips 02-26-2001 [id number]  Referring provider: Mimi Alt, MD [address] [place],  [place] [address]  Urological history: 1. rUTI's -E.coli (06/2023)  -E.coli (01/2023)  -cranberry tablets BID -90 days nitrofurantoin  (started 07/2023)   No chief complaint on file.  HPI: Destiny Phillips is a 23 y.o. woman who presents today for 4 month follow up for rUTI's.  Previous records reviewed.   She was initially seen by Dr. Estanislao Heimlich in January for the complaint of recurrent UTIs over the last 16 months.  She believes that the UTI's were precipitated by sexual activity.  Her UTI symptoms consist of urgency and dysuria.  She was instructed to take cranberry tabs twice daily as started on a 90-day regimen of nitrofurantoin  100 mg daily.     PMH: Past Medical History:  Diagnosis Date   Idiopathic thrombocytopenic purpura (ITP) (HCC)    SVT (supraventricular tachycardia) (HCC)    Urinary tract infection     Surgical History: Past Surgical History:  Procedure Laterality Date   BRONCHIAL BIOPSY  11/16/2021   Procedure: BRONCHIAL BIOPSIES;  Surgeon: Denson Flake, MD;  Location: [facility] ENDOSCOPY;  Service: Pulmonary;;   BRONCHIAL BRUSHINGS  11/16/2021   Procedure: BRONCHIAL BRUSHINGS;  Surgeon: Denson Flake, MD;  Location: [facility] ENDOSCOPY;  Service: Pulmonary;;   BRONCHIAL NEEDLE ASPIRATION BIOPSY  11/16/2021   Procedure: BRONCHIAL NEEDLE ASPIRATION BIOPSIES;  Surgeon: Denson Flake, MD;  Location: [facility] ENDOSCOPY;  Service: Pulmonary;;   BRONCHIAL WASHINGS  11/16/2021   Procedure: BRONCHIAL WASHINGS;  Surgeon: Denson Flake, MD;  Location: [facility] ENDOSCOPY;  Service: Pulmonary;;   CARDIAC ELECTROPHYSIOLOGY STUDY AND ABLATION     at [facility]   VIDEO BRONCHOSCOPY WITH RADIAL ENDOBRONCHIAL ULTRASOUND  11/16/2021   Procedure: VIDEO BRONCHOSCOPY WITH RADIAL ENDOBRONCHIAL ULTRASOUND;  Surgeon: Denson Flake, MD;  Location: [facility] ENDOSCOPY;  Service:  Pulmonary;;    Home Medications:  Allergies as of 11/09/2023   No Known Allergies      Medication List        Accurate as of November 08, 2023 10:34 AM. If you have any questions, ask your nurse or doctor.          Align Prebiotic-Probiotic 5-1.25 MG-GM Chew Chew by mouth.   escitalopram  10 MG tablet Commonly known as: LEXAPRO  TAKE ONE TABLET BY MOUTH DAILY   hydrOXYzine  25 MG capsule Commonly known as: VISTARIL  Take 1 capsule (25 mg total) by mouth every 8 (eight) hours as needed for anxiety.   METHYLCOBALAMIN PO Take 1 mg by mouth. 2-3 capsules a day   nitrofurantoin  (macrocrystal-monohydrate) 100 MG capsule Commonly known as: MACROBID  Take 1 capsule (100 mg total) by mouth daily.   SUMAtriptan 50 MG tablet Commonly known as: IMITREX Take 50 mg by mouth every 2 (two) hours as needed for migraine.   verapamil  120 MG CR tablet Commonly known as: CALAN -SR Take 1 tablet (120 mg total) by mouth daily as needed (for complex migraine).        Allergies: No Known Allergies  Family History: Family History  Problem Relation Age of Onset   Healthy Mother    Healthy Father    Cancer Paternal Uncle    Breast cancer Maternal Grandmother    Diabetes Maternal Grandfather    Colon cancer Paternal Grandfather     Social History:  reports that she has never smoked. She has never used smokeless tobacco. She reports that she does not drink alcohol and  does not use drugs.  ROS: Pertinent ROS in HPI  Physical Exam: There were no vitals taken for this visit.  Constitutional:  Well nourished. Alert and oriented, No acute distress. HEENT: [place] AT, moist mucus membranes.  Trachea midline, no masses. Cardiovascular: No clubbing, cyanosis, or edema. Respiratory: Normal respiratory effort, no increased work of breathing. GU: No CVA tenderness.  No bladder fullness or masses.  Recession of labia minora, dry, pale vulvar vaginal mucosa and loss of mucosal ridges and folds.   Normal urethral meatus, no lesions, no prolapse, no discharge.   No urethral masses, tenderness and/or tenderness. No bladder fullness, tenderness or masses. *** vagina mucosa, *** estrogen effect, no discharge, no lesions, *** pelvic support, *** cystocele and *** rectocele noted.  No cervical motion tenderness.  Uterus is freely mobile and non-fixed.  No adnexal/parametria masses or tenderness noted.  Anus and perineum are without rashes or lesions.   ***  Neurologic: Grossly intact, no focal deficits, moving all 4 extremities. Psychiatric: Normal mood and affect.    Laboratory Data: Lab Results  Component Value Date   WBC 5.8 06/30/2023   HGB 13.8 06/30/2023   HCT 42.9 06/30/2023   MCV 81 06/30/2023   PLT 112 (L) 06/30/2023   Lab Results  Component Value Date   CREATININE 0.87 01/17/2023   Lab Results  Component Value Date   HGBA1C 5.3 06/30/2023   Lab Results  Component Value Date   TSH 0.577 01/17/2023   Lab Results  Component Value Date   AST 20 01/17/2023   Lab Results  Component Value Date   ALT 19 01/17/2023   Urinalysis See EPIC and HPI  I have reviewed the labs.   Pertinent Imaging: N/A   Assessment & Plan:    1. rUTI's -no breakthrough infections since January -will continue with cranberry tablets BID  -she will contact us  for any UTI symptoms  No follow-ups on file.  These notes generated with voice recognition software. I apologize for typographical errors.  Briant Camper  [facility] [address]  [address] [place], [place] [address] [phone]

## 2023-11-09 ENCOUNTER — Encounter: Payer: Self-pay | Admitting: Urology

## 2023-11-09 ENCOUNTER — Ambulatory Visit (INDEPENDENT_AMBULATORY_CARE_PROVIDER_SITE_OTHER): Admitting: Urology

## 2023-11-09 VITALS — BP 111/75 | HR 106 | Ht 63.0 in | Wt 110.5 lb

## 2023-11-09 DIAGNOSIS — R3129 Other microscopic hematuria: Secondary | ICD-10-CM | POA: Diagnosis not present

## 2023-11-09 DIAGNOSIS — N390 Urinary tract infection, site not specified: Secondary | ICD-10-CM | POA: Diagnosis not present

## 2023-11-09 LAB — MICROSCOPIC EXAMINATION: Epithelial Cells (non renal): >10 /HPF — AB (ref 0–10)

## 2023-11-09 LAB — URINALYSIS, COMPLETE
Bilirubin, UA: NEGATIVE
Ketones, UA: NEGATIVE
Leukocytes,UA: NEGATIVE
Nitrite, UA: NEGATIVE
Specific Gravity, UA: >1.03 — ABNORMAL HIGH (ref 1.005–1.030)
Urobilinogen, Ur: 0.2 mg/dL (ref 0.2–1.0)
pH, UA: 6 (ref 5.0–7.5)

## 2023-11-11 ENCOUNTER — Ambulatory Visit
Admission: RE | Admit: 2023-11-11 | Discharge: 2023-11-11 | Disposition: A | Discharge status: Home / Self Care | Source: Ambulatory Visit | Attending: Urology | Admitting: Urology

## 2023-11-11 DIAGNOSIS — N390 Urinary tract infection, site not specified: Secondary | ICD-10-CM | POA: Insufficient documentation

## 2023-11-11 DIAGNOSIS — Z1321 Encounter for screening for nutritional disorder: Secondary | ICD-10-CM | POA: Diagnosis not present

## 2023-11-11 DIAGNOSIS — Z1329 Encounter for screening for other suspected endocrine disorder: Secondary | ICD-10-CM | POA: Diagnosis not present

## 2023-11-11 DIAGNOSIS — N1330 Unspecified hydronephrosis: Secondary | ICD-10-CM | POA: Diagnosis not present

## 2023-11-11 DIAGNOSIS — D696 Thrombocytopenia, unspecified: Secondary | ICD-10-CM | POA: Diagnosis not present

## 2023-11-11 DIAGNOSIS — E559 Vitamin D deficiency, unspecified: Secondary | ICD-10-CM | POA: Diagnosis not present

## 2023-11-11 DIAGNOSIS — N926 Irregular menstruation, unspecified: Secondary | ICD-10-CM | POA: Diagnosis not present

## 2023-11-11 DIAGNOSIS — R3129 Other microscopic hematuria: Secondary | ICD-10-CM | POA: Diagnosis not present

## 2023-11-11 DIAGNOSIS — R5383 Other fatigue: Secondary | ICD-10-CM | POA: Diagnosis not present

## 2023-11-11 DIAGNOSIS — E611 Iron deficiency: Secondary | ICD-10-CM | POA: Diagnosis not present

## 2023-11-12 LAB — CULTURE, URINE COMPREHENSIVE

## 2023-11-24 ENCOUNTER — Encounter: Payer: Self-pay | Admitting: Urology

## 2023-11-24 ENCOUNTER — Ambulatory Visit (INDEPENDENT_AMBULATORY_CARE_PROVIDER_SITE_OTHER): Admitting: Urology

## 2023-11-24 VITALS — BP 109/72 | HR 106 | Ht 63.0 in | Wt 110.0 lb

## 2023-11-24 DIAGNOSIS — R3129 Other microscopic hematuria: Secondary | ICD-10-CM | POA: Diagnosis not present

## 2023-11-24 DIAGNOSIS — R161 Splenomegaly, not elsewhere classified: Secondary | ICD-10-CM

## 2023-11-24 DIAGNOSIS — N390 Urinary tract infection, site not specified: Secondary | ICD-10-CM | POA: Diagnosis not present

## 2023-11-24 DIAGNOSIS — N2889 Other specified disorders of kidney and ureter: Secondary | ICD-10-CM | POA: Diagnosis not present

## 2023-11-24 LAB — URINALYSIS, COMPLETE
Bilirubin, UA: NEGATIVE
Glucose, UA: NEGATIVE
Ketones, UA: NEGATIVE
Nitrite, UA: NEGATIVE
Protein,UA: NEGATIVE
RBC, UA: NEGATIVE
Specific Gravity, UA: 1.01 (ref 1.005–1.030)
Urobilinogen, Ur: 0.2 mg/dL (ref 0.2–1.0)
pH, UA: 6.5 (ref 5.0–7.5)

## 2023-11-24 LAB — MICROSCOPIC EXAMINATION: Epithelial Cells (non renal): >10 /HPF — AB (ref 0–10)

## 2023-11-24 NOTE — Progress Notes (Signed)
 11/24/2023 10:57 AM   Destiny Phillips [birth year] [id number]  Referring provider: Mimi Alt, MD [address] [place],  [place] [address]  Urological history: 1. rUTI's -E.coli (06/2023)  -E.coli (01/2023)  -cranberry tablets BID -90 days nitrofurantoin  (started 07/2023)   Chief Complaint  Patient presents with   Follow-up   HPI: Destiny Phillips is a 23 y.o. woman who presents today for 4 month follow up for rUTI's.  Previous records reviewed.   At her visit on November 09, 2023, she was initially seen by Dr. Estanislao Heimlich in January for the complaint of recurrent UTIs over the last 16 months.  She believes that the UTI's were precipitated by sexual activity.  Her UTI symptoms consist of urgency and dysuria.  She was instructed to take cranberry tabs twice daily as started on a 90-day regimen of nitrofurantoin  100 mg daily.  She states she feels okay today, but she is having lower abdominal pain.  She states that she is having her menses, but she is not convinced that the microscopic blood that we see in her urinalysis is from her menstrual cycle.  She is requesting further imaging at this time.   Patient denies any modifying or aggravating factors.  Patient denies any recent UTI's, gross hematuria, dysuria or suprapubic/flank pain.  Patient denies any fevers, chills, nausea or vomiting.  UA is yellow clear, trace glucose, specific gravity 1.030, trace heme, pH 6.0, 1+ protein, 0-5 WBCs, 3-10 RBCs, greater than  10 epithelial cells, granular and hyaline cast present, mucus threads present and moderate bacteria.  She has completed the nitrofurantoin .  Today's UA is yellow clear, specific gravity 1.010, pH 6.5, 1+ leukocyte, 6-10 WBCs, 0-2 RBCs, greater than 10 epithelial cells, mucus threads present and a few bacteria.  She feels well today.  She did share with me a story that when she was 20 she did put off seeking treatment for urinary tract infection and it did result in  a severe episode of pyelonephritis.  She also shared that her mother has a history of AMH and has been worked up thoroughly and no etiology was discovered.  Patient denies any modifying or aggravating factors.  Patient denies any recent UTI's, gross hematuria, dysuria or suprapubic/flank pain.  Patient denies any fevers, chills, nausea or vomiting.    PMH: Past Medical History:  Diagnosis Date   Idiopathic thrombocytopenic purpura (ITP) (HCC)    SVT (supraventricular tachycardia) (HCC)    Urinary tract infection     Surgical History: Past Surgical History:  Procedure Laterality Date   BRONCHIAL BIOPSY  11/16/2021   Procedure: BRONCHIAL BIOPSIES;  Surgeon: Denson Flake, MD;  Location: [facility] ENDOSCOPY;  Service: Pulmonary;;   BRONCHIAL BRUSHINGS  11/16/2021   Procedure: BRONCHIAL BRUSHINGS;  Surgeon: Denson Flake, MD;  Location: [facility] ENDOSCOPY;  Service: Pulmonary;;   BRONCHIAL NEEDLE ASPIRATION BIOPSY  11/16/2021   Procedure: BRONCHIAL NEEDLE ASPIRATION BIOPSIES;  Surgeon: Denson Flake, MD;  Location: [facility] ENDOSCOPY;  Service: Pulmonary;;   BRONCHIAL WASHINGS  11/16/2021   Procedure: BRONCHIAL WASHINGS;  Surgeon: Denson Flake, MD;  Location: [facility] ENDOSCOPY;  Service: Pulmonary;;   CARDIAC ELECTROPHYSIOLOGY STUDY AND ABLATION     at [facility]   VIDEO BRONCHOSCOPY WITH RADIAL ENDOBRONCHIAL ULTRASOUND  11/16/2021   Procedure: VIDEO BRONCHOSCOPY WITH RADIAL ENDOBRONCHIAL ULTRASOUND;  Surgeon: Denson Flake, MD;  Location: [facility] ENDOSCOPY;  Service: Pulmonary;;    Home Medications:  Allergies as of 11/24/2023   No Known Allergies  Medication List        Accurate as of Nov 24, 2023 10:57 AM. If you have any questions, ask your nurse or doctor.          STOP taking these medications    SUMAtriptan 50 MG tablet Commonly known as: IMITREX       TAKE these medications    Align Prebiotic-Probiotic 5-1.25 MG-GM Chew Chew by mouth.   escitalopram  10 MG tablet Commonly known as:  LEXAPRO  TAKE ONE TABLET BY MOUTH DAILY   hydrOXYzine  25 MG capsule Commonly known as: VISTARIL  Take 1 capsule (25 mg total) by mouth every 8 (eight) hours as needed for anxiety.   METHYLCOBALAMIN PO Take 1 mg by mouth. 2-3 capsules a day   verapamil  120 MG CR tablet Commonly known as: CALAN -SR Take 1 tablet (120 mg total) by mouth daily as needed (for complex migraine).        Allergies: No Known Allergies  Family History: Family History  Problem Relation Age of Onset   Healthy Mother    Healthy Father    Cancer Paternal Uncle    Breast cancer Maternal Grandmother    Diabetes Maternal Grandfather    Colon cancer Paternal Grandfather     Social History:  reports that she has never smoked. She has never used smokeless tobacco. She reports that she does not drink alcohol and does not use drugs.  ROS: Pertinent ROS in HPI  Physical Exam: BP 109/72   Pulse (!) 106   Ht 5\' 3"  (1.6 m)   Wt 110 lb (49.9 kg)   BMI 19.49 kg/m   Constitutional:  Well nourished. Alert and oriented, No acute distress. HEENT: [place] AT, moist mucus membranes.  Trachea midline Cardiovascular: No clubbing, cyanosis, or edema. Respiratory: Normal respiratory effort, no increased work of breathing. Neurologic: Grossly intact, no focal deficits, moving all 4 extremities. Psychiatric: Normal mood and affect.    Laboratory Data: Urinalysis See EPIC and HPI  I have reviewed the labs.   Pertinent Imaging: CLINICAL DATA:  Microscopic hematuria   EXAM: RENAL / URINARY TRACT ULTRASOUND COMPLETE   COMPARISON:  None Available.   FINDINGS: Right Kidney:   Renal measurements: 10.5 x 3.2 x 4.3 cm = volume: 75.1 mL. Normal renal cortical thickness and echogenicity. Mild pelviectasis. No mass.   Left Kidney:   Renal measurements: 11.3 x 3.0 x 4.4 cm = volume: 79.2 mL. Normal renal cortical thickness and echogenicity. No hydronephrosis. 1.1 cm cyst.   Bladder:   Appears normal for degree of  bladder distention.   Other:   Incidental note is made of splenomegaly measuring 16.2 cm.   IMPRESSION: 1. Mild right pelviectasis. 2. Splenomegaly.     Electronically Signed   By: Jone Neither M.D.   On: 11/12/2023 13:53 I have independently reviewed the films.  See HPI.    Assessment & Plan:    1. Right pelviectasis - Discussed that this may be an incidental finding and normal variant for her and that we will repeat the renal ultrasound in 6 months to confirm stability, resolution or if the right pelviectasis becomes more severe, we may need to consider possible reflux and/or ureteral obstruction by stone, etc.  2.  Splenomegaly - Encouraged to reach out to PCP - Likely secondary to her ITP  3. rUTI's -no breakthrough infections since January -will continue with cranberry tablets BID  - She will reach out to us  for any signs of breakthrough infection  4. Microscopic hematuria -  No micro heme seen on today's UA  Return in about 6 months (around 05/26/2024) for RUS report, UA and symptom recheck .  These notes generated with voice recognition software. I apologize for typographical errors.  Briant Camper  [facility] [address]  [address] [place], [place] [address] [phone]

## 2024-01-23 DIAGNOSIS — F4010 Social phobia, unspecified: Secondary | ICD-10-CM | POA: Diagnosis not present

## 2024-02-02 ENCOUNTER — Ambulatory Visit: Admitting: Family Medicine

## 2024-02-07 DIAGNOSIS — F411 Generalized anxiety disorder: Secondary | ICD-10-CM | POA: Diagnosis not present

## 2024-02-16 DIAGNOSIS — F411 Generalized anxiety disorder: Secondary | ICD-10-CM | POA: Diagnosis not present

## 2024-02-17 ENCOUNTER — Ambulatory Visit: Payer: Self-pay

## 2024-02-17 NOTE — Telephone Encounter (Signed)
  FYI Only or Action Required?: FYI only for provider.  Patient was last seen in primary care on 06/30/2023 by Sharma Coyer, MD.  Called Nurse Triage reporting Appointment.  Symptoms began several months ago.  Interventions attempted: Nothing.  Symptoms are: stable.  Triage Disposition: No disposition on file.  Patient/caregiver understands and will follow disposition?:     Copied from CRM [id number]. Topic: Clinical - Red Word Triage >> Feb 17, 2024  1:44 PM Destiny Phillips wrote: Destiny Phillips that prompted transfer to Nurse Triage: stool inconsistency - swollen spleen. has a blood disorder. Reason for Disposition  Nursing judgment or information in reference  Answer Assessment - Initial Assessment Questions 1. REASON FOR CALL: What is your main concern right now?     Called to reschedule apt that she missed in July. Apt scheduled; no red words, no tirage.  Protocols used: No Guideline Available-A-AH

## 2024-02-20 ENCOUNTER — Encounter: Payer: Self-pay | Admitting: Family Medicine

## 2024-02-20 ENCOUNTER — Ambulatory Visit (INDEPENDENT_AMBULATORY_CARE_PROVIDER_SITE_OTHER): Admitting: Family Medicine

## 2024-02-20 VITALS — BP 118/73 | HR 112 | Temp 97.7°F | Ht 63.0 in | Wt 110.0 lb

## 2024-02-20 DIAGNOSIS — R161 Splenomegaly, not elsewhere classified: Secondary | ICD-10-CM

## 2024-02-20 NOTE — Telephone Encounter (Signed)
 Patient call note and symptoms reviewed. Agree with scheduled appt. Will evaluate during OV

## 2024-02-20 NOTE — Progress Notes (Signed)
 Established patient visit   Patient: Destiny Phillips   DOB: 2001-04-19   23 y.o. Female  MRN: [id number] Visit Date: 02/20/2024  Today's healthcare provider: Rockie Agent, MD   Chief Complaint  Patient presents with   Acute Visit    Follow up on ultrasound, enlarged spleen.  Thinks that she has something to do with her blood disorder and wants some advice.     Subjective     HPI     Acute Visit    Additional comments: Follow up on ultrasound, enlarged spleen.  Thinks that she has something to do with her blood disorder and wants some advice.        Last edited by Terrel Powell CROME, CMA on 02/20/2024  3:28 PM.       Discussed the use of AI scribe software for clinical note transcription with the patient, who gave verbal consent to proceed.  History of Present Illness Destiny Phillips is a 23 year old female with immunodeficiency and chronic idiopathic thrombocytopenia who presents for follow-up after a renal ultrasound showing splenomegaly.  She underwent a renal ultrasound at her urology office, which revealed mild right pelvic ectasia and splenomegaly, with the spleen measuring 16.2 centimeters, approximately 3 centimeters larger than normal.  She has no pain associated with the splenomegaly and was unaware of the enlargement until informed. She can feel the edge of her spleen past her ribs, describing it as 'a little soft.' No pain or discomfort upon palpation.  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 She acknowledges her condition results in a 'weak immune system.'  In her social history, she has recently taken up playing pickleball and tends to play intensely. She wants to be more gentle in her activities to avoid any potential injury to her spleen.     Past Medical History:  Diagnosis Date   Idiopathic thrombocytopenic purpura (ITP) (HCC)    SVT (supraventricular tachycardia) (HCC)     Urinary tract infection     Medications: Outpatient Medications Prior to Visit  Medication Sig   Bacillus Coagulans-Inulin (ALIGN PREBIOTIC-PROBIOTIC) 5-1.25 MG-GM CHEW Chew by mouth.   hydrOXYzine  (VISTARIL ) 25 MG capsule Take 1 capsule (25 mg total) by mouth every 8 (eight) hours as needed for anxiety.   METHYLCOBALAMIN PO Take 1 mg by mouth. 2-3 capsules a day   verapamil  (CALAN -SR) 120 MG CR tablet Take 1 tablet (120 mg total) by mouth daily as needed (for complex migraine).   escitalopram  (LEXAPRO ) 10 MG tablet TAKE ONE TABLET BY MOUTH DAILY (Patient not taking: Reported on 02/20/2024)   No facility-administered medications prior to visit.   No results found.  Review of Systems      Objective    BP 118/73 (BP Location: Left Arm, Patient Position: Sitting, Cuff Size: Normal)   Pulse (!) 112   Temp 97.7 F (36.5 C) (Oral)   Ht 5' 3 (1.6 m)   Wt 110 lb (49.9 kg)   SpO2 99%   BMI 19.49 kg/m  BP Readings from Last 3 Encounters:  02/20/24 118/73  11/24/23 109/72  11/09/23 111/75   Wt Readings from Last 3 Encounters:  02/20/24 110 lb (49.9 kg)  11/24/23 110 lb (49.9 kg)  11/09/23 110 lb 8 oz (50.1 kg)        Physical Exam  Physical Exam ABDOMEN: Splenomegaly palpable below the costal margin. No tenderness to palpation    No results found  for any visits on 02/20/24.  Assessment & Plan     Problem List Items Addressed This Visit   None Visit Diagnoses       Splenomegaly    -  Primary   Relevant Orders   Ambulatory referral to Hematology / Oncology       Assessment and Plan Assessment & Plan Splenomegaly in the setting of chronic idiopathic thrombocytopenic purpura and common variable immunodeficiency Splenomegaly, measuring 16.2 cm on renal ultrasound, likely related to chronic idiopathic thrombocytopenic purpura (ITP) and common variable immunodeficiency. No current pain or symptoms. Risk of splenic rupture discussed, emphasizing caution in physical  activities. No immediate pharmacological treatment available to reduce spleen size. - Refer to hematology/oncology for evaluation of splenomegaly.  Discussed case with Dr. Babara (heme/onc) who recommended that the patient have further evaluation for splenomegaly, will place formal heme referral  - Order abdominal ultrasound to monitor spleen size. - Advise caution in physical activities to prevent splenic rupture. - Plan follow-up scan in November to reassess spleen size.  Chronic idiopathic thrombocytopenic purpura Chronic condition contributing to splenomegaly. No acute symptoms reported. - Refer to hematology/oncology for evaluation and management of ITP.  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 No acute symptoms reported. - Refer to hematology/oncology for evaluation and management of common variable immunodeficiency.      Return in about 4 months (around 06/21/2024).         Rockie Agent, MD  [facility] [phone] (phone) [phone] (fax)  [facility]

## 2024-02-23 DIAGNOSIS — F411 Generalized anxiety disorder: Secondary | ICD-10-CM | POA: Diagnosis not present

## 2024-03-01 ENCOUNTER — Inpatient Hospital Stay (HOSPITAL_BASED_OUTPATIENT_CLINIC_OR_DEPARTMENT_OTHER): Admitting: Oncology

## 2024-03-01 ENCOUNTER — Inpatient Hospital Stay: Attending: Oncology

## 2024-03-01 ENCOUNTER — Encounter: Payer: Self-pay | Admitting: Oncology

## 2024-03-01 VITALS — BP 108/77 | HR 100 | Temp 98.9°F | Resp 16 | Wt 112.0 lb

## 2024-03-01 DIAGNOSIS — D809 Immunodeficiency with predominantly antibody defects, unspecified: Secondary | ICD-10-CM

## 2024-03-01 DIAGNOSIS — Z803 Family history of malignant neoplasm of breast: Secondary | ICD-10-CM | POA: Insufficient documentation

## 2024-03-01 DIAGNOSIS — E611 Iron deficiency: Secondary | ICD-10-CM | POA: Diagnosis not present

## 2024-03-01 DIAGNOSIS — Z800 Family history of malignant neoplasm of digestive organs: Secondary | ICD-10-CM | POA: Insufficient documentation

## 2024-03-01 DIAGNOSIS — R161 Splenomegaly, not elsewhere classified: Secondary | ICD-10-CM | POA: Insufficient documentation

## 2024-03-01 DIAGNOSIS — R21 Rash and other nonspecific skin eruption: Secondary | ICD-10-CM | POA: Diagnosis not present

## 2024-03-01 DIAGNOSIS — D803 Selective deficiency of immunoglobulin G [IgG] subclasses: Secondary | ICD-10-CM | POA: Insufficient documentation

## 2024-03-01 DIAGNOSIS — D693 Immune thrombocytopenic purpura: Secondary | ICD-10-CM | POA: Insufficient documentation

## 2024-03-01 DIAGNOSIS — Z79899 Other long term (current) drug therapy: Secondary | ICD-10-CM | POA: Diagnosis not present

## 2024-03-01 LAB — RETIC PANEL
Immature Retic Fract: 5.8 % (ref 2.3–15.9)
RBC.: 5.28 MIL/uL — ABNORMAL HIGH (ref 3.87–5.11)
Retic Count, Absolute: 69.7 K/uL (ref 19.0–186.0)
Retic Ct Pct: 1.3 % (ref 0.4–3.1)
Reticulocyte Hemoglobin: 30.5 pg (ref >27.9)

## 2024-03-01 LAB — CBC WITH DIFFERENTIAL/PLATELET
Abs Immature Granulocytes: 0.03 K/uL (ref 0.00–0.07)
Basophils Absolute: 0 K/uL (ref 0.0–0.1)
Basophils Relative: 0 %
Eosinophils Absolute: 0.1 K/uL (ref 0.0–0.5)
Eosinophils Relative: 2 %
HCT: 42 % (ref 36.0–46.0)
Hemoglobin: 13.6 g/dL (ref 12.0–15.0)
Immature Granulocytes: 1 %
Lymphocytes Relative: 29 %
Lymphs Abs: 1.6 K/uL (ref 0.7–4.0)
MCH: 26 pg (ref 26.0–34.0)
MCHC: 32.4 g/dL (ref 30.0–36.0)
MCV: 80.2 fL (ref 80.0–100.0)
Monocytes Absolute: 0.4 K/uL (ref 0.1–1.0)
Monocytes Relative: 8 %
Neutro Abs: 3.4 K/uL (ref 1.7–7.7)
Neutrophils Relative %: 60 %
Platelets: 111 K/uL — ABNORMAL LOW (ref 150–400)
RBC: 5.24 MIL/uL — ABNORMAL HIGH (ref 3.87–5.11)
RDW: 13.3 % (ref 11.5–15.5)
WBC: 5.6 K/uL (ref 4.0–10.5)
nRBC: 0 % (ref 0.0–0.2)

## 2024-03-01 LAB — IRON AND TIBC
Iron: 86 ug/dL (ref 28–170)
Saturation Ratios: 20 % (ref 10.4–31.8)
TIBC: 434 ug/dL (ref 250–450)
UIBC: 348 ug/dL

## 2024-03-01 LAB — COMPREHENSIVE METABOLIC PANEL WITH GFR
ALT: 17 U/L (ref 0–44)
AST: 20 U/L (ref 15–41)
Albumin: 4.7 g/dL (ref 3.5–5.0)
Alkaline Phosphatase: 79 U/L (ref 38–126)
Anion gap: 10 (ref 5–15)
BUN: 13 mg/dL (ref 6–20)
CO2: 24 mmol/L (ref 22–32)
Calcium: 9.5 mg/dL (ref 8.9–10.3)
Chloride: 103 mmol/L (ref 98–111)
Creatinine, Ser: 0.61 mg/dL (ref 0.44–1.00)
GFR, Estimated: >60 mL/min (ref >60)
Glucose, Bld: 74 mg/dL (ref 70–99)
Potassium: 4.1 mmol/L (ref 3.5–5.1)
Sodium: 137 mmol/L (ref 135–145)
Total Bilirubin: 0.6 mg/dL (ref 0.0–1.2)
Total Protein: 6.8 g/dL (ref 6.5–8.1)

## 2024-03-01 LAB — FERRITIN: Ferritin: 33 ng/mL (ref 11–307)

## 2024-03-01 LAB — IMMATURE PLATELET FRACTION: Immature Platelet Fraction: 0.7 % — ABNORMAL LOW (ref 1.2–8.6)

## 2024-03-01 NOTE — Assessment & Plan Note (Addendum)
 Recommend patient to establish care with immunology for further evaluation.  Possible CVID Repeat immunoglobulin levels.

## 2024-03-01 NOTE — Assessment & Plan Note (Signed)
 Lab Results  Component Value Date   HGB 13.6 03/01/2024   TIBC 434 03/01/2024   IRONPCTSAT 20 03/01/2024   FERRITIN 33 03/01/2024    Currently no signs of iron deficiency.  Hold off iron supplementation.

## 2024-03-01 NOTE — Assessment & Plan Note (Signed)
 Newly discovered splenomegaly, 16 cm. Recommend dedicated ultrasound abdomen left upper quadrant and right upper quadrant for further evaluation. Check BCR-ABL 1 FISH, JAK2 mutation with reflex, ANA, EBV, CMV, flow cytometry.

## 2024-03-01 NOTE — Assessment & Plan Note (Addendum)
 Previous history of ITP responded to dexamethasone  40 mg daily for 4 days. It appears that ITP always respond very well to steroid treatments. Discussed about future options if recurrent ITP or refractory ITP.  These options include splenectomy, CD20 antibodies, TPO agonist.  Currently given that her ITP responses very well to steroids and no frequent recurrence.  I recommend continue observation and check blood work every 6 months.  She agrees with the plan.

## 2024-03-01 NOTE — Progress Notes (Signed)
 Hematology/Oncology Consult note [phone] [phone]         Patient Care Team: Sharma Coyer, MD as PCP - General (Family Medicine) Babara Call, MD as Consulting Physician (Oncology)  REFERRING PROVIDER: Sharma Bullocks*   CHIEF COMPLAINTS/REASON FOR VISIT:  Thrombocytopenia, ITP, immunoglobulin deficiency  ASSESSMENT & PLAN:   Chronic ITP (idiopathic thrombocytopenia) (HCC) Previous history of ITP responded to dexamethasone  40 mg daily for 4 days. It appears that ITP always respond very well to steroid treatments. Discussed about future options if recurrent ITP or refractory ITP.  These options include splenectomy, CD20 antibodies, TPO agonist.  Currently given that her ITP responses very well to steroids and no frequent recurrence.  I recommend continue observation and check blood work every 6 months.  She agrees with the plan.    Immunoglobulin deficiency (HCC) Recommend patient to establish care with immunology for further evaluation.  Possible CVID Repeat immunoglobulin levels.  Splenomegaly Newly discovered splenomegaly, 16 cm. Recommend dedicated ultrasound abdomen left upper quadrant and right upper quadrant for further evaluation. Check BCR-ABL 1 FISH, JAK2 mutation with reflex, ANA, EBV, CMV, flow cytometry.  Iron deficiency Lab Results  Component Value Date   HGB 13.6 03/01/2024   TIBC 434 03/01/2024   IRONPCTSAT 20 03/01/2024   FERRITIN 33 03/01/2024    Currently no signs of iron deficiency.  Hold off iron supplementation.   Orders Placed This Encounter  Procedures   US  Abdomen Limited    Standing Status:   Future    Expected Date:   03/08/2024    Expiration Date:   03/01/2025    Reason for Exam (SYMPTOM  OR DIAGNOSIS REQUIRED):   US  LUQ- splenomegaly, thormbocytopenia    Preferred imaging location?:   [facility]   US  ABDOMEN LIMITED RUQ (LIVER/GB)    Standing Status:   Future    Expected Date:    03/08/2024    Expiration Date:   03/01/2025    Reason for Exam (SYMPTOM  OR DIAGNOSIS REQUIRED):   splenomegaly , thormbocytopenia    Preferred imaging location?:   [facility]   Ferritin    Standing Status:   Future    Number of Occurrences:   1    Expected Date:   03/01/2024    Expiration Date:   05/30/2024   Iron and TIBC    Standing Status:   Future    Number of Occurrences:   1    Expected Date:   03/01/2024    Expiration Date:   05/30/2024   CBC with Differential/Platelet    Standing Status:   Future    Number of Occurrences:   1    Expected Date:   03/01/2024    Expiration Date:   05/30/2024   Retic Panel    Standing Status:   Future    Number of Occurrences:   1    Expected Date:   03/01/2024    Expiration Date:   05/30/2024   Immature Platelet Fraction    Standing Status:   Future    Number of Occurrences:   1    Expected Date:   03/01/2024    Expiration Date:   05/30/2024   Comprehensive metabolic panel with GFR    Standing Status:   Future    Number of Occurrences:   1    Expected Date:   03/01/2024    Expiration Date:   05/30/2024   Epstein barr vrs(ebv dna by pcr)    Standing Status:  Future    Number of Occurrences:   1    Expected Date:   03/01/2024    Expiration Date:   03/01/2025   JAK2 V617F rfx CALR/MPL/E12-15    Standing Status:   Future    Number of Occurrences:   1    Expected Date:   03/01/2024    Expiration Date:   05/30/2024   BCR-ABL1 FISH    Standing Status:   Future    Number of Occurrences:   1    Expected Date:   03/01/2024    Expiration Date:   05/30/2024   Immunoglobulins, QN, A/E/G/M    Standing Status:   Future    Number of Occurrences:   1    Expected Date:   03/01/2024    Expiration Date:   03/01/2025   Flow cytometry panel-leukemia/lymphoma work-up    Standing Status:   Future    Number of Occurrences:   1    Expected Date:   03/01/2024    Expiration Date:   03/01/2025   Follow-up in 6 months All questions were answered. The  patient knows to call the clinic with any problems, questions or concerns.  Zelphia Cap, MD, PhD [facility] Hematology Oncology 03/01/2024   HISTORY OF PRESENTING ILLNESS:   Destiny Phillips is a  23 y.o.  female with PMH listed below was seen in consultation at the request of  [facility]*  for evaluation of ITP Patient has a history of chronic ITP Previously seen by pediatric hematologist at [facility].  Reviewed previous hematologist note.  She was last seen in 2018 there.  History of acute ITP, she has previously tried IVIG and it did not response.  She responded to steroids well.  ITP always resolves after steroid treatments. She had 1 episode as a young child, then in 2010, and 2017.  Recently she had another acute ITP flare with platelet count dropped to 18,000.  She has noticed petechiae rash.  Her primary care provider prescribed dexamethasone  40 mg daily for 4 days and a follow-up CBC showed a platelet count of 134,000.  Patient was referred to establish care with hematology.  May 2023, patient was seen by [facility] pulmonologist Dr.Meier for patchy airspace opacities in her lungs..  Patient underwent extensive pulmonology workup she had bronchoscopy biopsy-negative for malignancy.  ANA is negative it was noted that her immunoglobulin levels were decreased-low IgA, IgG and IgM..  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  She has not established care yet.   INTERVAL HISTORY Destiny Phillips is a 23 y.o. female who has above history reviewed by me today presents for follow up visit for thrombocytopenia, splenomegaly, immunoglobulin deficiency.  Patient presented to establish care as recently she had workup for microscopic hematuria.  Incidental finding on ultrasound kidney but patient has splenomegaly 16.8 cm.  Patient reports feeling well.  No bleeding events.  Denies routine alcohol use, toxin exposure.  No unintentional  weight loss night sweats or fever.  MEDICAL HISTORY:  Past Medical History:  Diagnosis Date   Idiopathic thrombocytopenic purpura (ITP) (HCC)    SVT (supraventricular tachycardia) (HCC)    Urinary tract infection     SURGICAL HISTORY: Past Surgical History:  Procedure Laterality Date   BRONCHIAL BIOPSY  11/16/2021   Procedure: BRONCHIAL BIOPSIES;  Surgeon: Shelah Lamar RAMAN, MD;  Location: [facility] ENDOSCOPY;  Service: Pulmonary;;   BRONCHIAL BRUSHINGS  11/16/2021  Procedure: BRONCHIAL BRUSHINGS;  Surgeon: Shelah Lamar RAMAN, MD;  Location: [facility] ENDOSCOPY;  Service: Pulmonary;;   BRONCHIAL NEEDLE ASPIRATION BIOPSY  11/16/2021   Procedure: BRONCHIAL NEEDLE ASPIRATION BIOPSIES;  Surgeon: Shelah Lamar RAMAN, MD;  Location: [facility] ENDOSCOPY;  Service: Pulmonary;;   BRONCHIAL WASHINGS  11/16/2021   Procedure: BRONCHIAL WASHINGS;  Surgeon: Shelah Lamar RAMAN, MD;  Location: [facility] ENDOSCOPY;  Service: Pulmonary;;   CARDIAC ELECTROPHYSIOLOGY STUDY AND ABLATION     at [facility]   VIDEO BRONCHOSCOPY WITH RADIAL ENDOBRONCHIAL ULTRASOUND  11/16/2021   Procedure: VIDEO BRONCHOSCOPY WITH RADIAL ENDOBRONCHIAL ULTRASOUND;  Surgeon: Shelah Lamar RAMAN, MD;  Location: [facility] ENDOSCOPY;  Service: Pulmonary;;    SOCIAL HISTORY: Social History   Socioeconomic History   Marital status: Single    Spouse name: Not on file   Number of children: Not on file   Years of education: Not on file   Highest education level: Not on file  Occupational History   Not on file  Tobacco Use   Smoking status: Never   Smokeless tobacco: Never  Substance and Sexual Activity   Alcohol use: Never   Drug use: Never   Sexual activity: Yes    Birth control/protection: I.U.D.  Other Topics Concern   Not on file  Social History Narrative   Not on file   Social Drivers of Health   Financial Resource Strain: Low Risk  (11/30/2022)   Overall Financial Resource Strain (CARDIA)    Difficulty of Paying Living Expenses: Not hard at all  Food Insecurity: No Food  Insecurity (11/30/2022)   Hunger Vital Sign    Worried About Running Out of Food in the Last Year: Never true    Ran Out of Food in the Last Year: Never true  Transportation Needs: Not on file  Physical Activity: Sufficiently Active (11/30/2022)   Exercise Vital Sign    Days of Exercise per Week: 5 days    Minutes of Exercise per Session: 40 min  Stress: Stress Concern Present (11/30/2022)   [employer] of Occupational Health - Occupational Stress Questionnaire    Feeling of Stress : Rather much  Social Connections: Moderately Integrated (11/30/2022)   Social Connection and Isolation Panel    Frequency of Communication with Friends and Family: More than three times a week    Frequency of Social Gatherings with Friends and Family: More than three times a week    Attends Religious Services: More than 4 times per year    Active Member of [employer] or Organizations: Yes    Attends Engineer, structural: More than 4 times per year    Marital Status: Never married  Catering manager Violence: Not on file    FAMILY HISTORY: Family History  Problem Relation Age of Onset   Healthy Mother    Healthy Father    Cancer Paternal Uncle    Breast cancer Maternal Grandmother    Diabetes Maternal Grandfather    Colon cancer Paternal Grandfather     ALLERGIES:  has no known allergies.  MEDICATIONS:  Current Outpatient Medications  Medication Sig Dispense Refill   Bacillus Coagulans-Inulin (ALIGN PREBIOTIC-PROBIOTIC) 5-1.25 MG-GM CHEW Chew by mouth.     hydrOXYzine  (VISTARIL ) 25 MG capsule Take 1 capsule (25 mg total) by mouth every 8 (eight) hours as needed for anxiety. 90 capsule 0   METHYLCOBALAMIN PO Take 1 mg by mouth. 2-3 capsules a day     verapamil  (CALAN -SR) 120 MG CR tablet Take 1 tablet (120 mg total) by mouth  daily as needed (for complex migraine). 90 tablet 1   escitalopram  (LEXAPRO ) 10 MG tablet TAKE ONE TABLET BY MOUTH DAILY (Patient not taking: Reported on 03/01/2024) 90  tablet 0   No current facility-administered medications for this visit.    Review of Systems  Constitutional:  Negative for appetite change, chills, fatigue and fever.  HENT:   Negative for hearing loss and voice change.   Eyes:  Negative for eye problems.  Respiratory:  Negative for chest tightness and cough.   Cardiovascular:  Negative for chest pain.  Gastrointestinal:  Negative for abdominal distention, abdominal pain and blood in stool.  Endocrine: Negative for hot flashes.  Genitourinary:  Negative for difficulty urinating and frequency.   Musculoskeletal:  Negative for arthralgias.  Skin:  Negative for itching and rash.  Neurological:  Negative for extremity weakness.  Hematological:  Negative for adenopathy. Bruises/bleeds easily.  Psychiatric/Behavioral:  Negative for confusion.    PHYSICAL EXAMINATION: Vitals:   03/01/24 1027  BP: 108/77  Pulse: 100  Resp: 16  Temp: 98.9 F (37.2 C)  SpO2: 100%   Filed Weights   03/01/24 1027  Weight: 112 lb (50.8 kg)    Physical Exam Constitutional:      General: She is not in acute distress. HENT:     Head: Normocephalic and atraumatic.  Eyes:     General: No scleral icterus. Cardiovascular:     Rate and Rhythm: Normal rate and regular rhythm.     Heart sounds: Normal heart sounds.  Pulmonary:     Effort: Pulmonary effort is normal. No respiratory distress.     Breath sounds: No wheezing.  Abdominal:     General: Bowel sounds are normal. There is no distension.     Palpations: Abdomen is soft.  Musculoskeletal:        General: No deformity. Normal range of motion.     Cervical back: Normal range of motion and neck supple.  Skin:    General: Skin is warm and dry.     Findings: No erythema or rash.  Neurological:     Mental Status: She is alert and oriented to person, place, and time. Mental status is at baseline.     Cranial Nerves: No cranial nerve deficit.     Coordination: Coordination normal.  Psychiatric:         Mood and Affect: Mood normal.     LABORATORY DATA:  I have reviewed the data as listed    Latest Ref Rng & Units 03/01/2024   11:25 AM 06/30/2023   10:03 AM 01/17/2023   11:17 AM  CBC  WBC 4.0 - 10.5 K/uL 5.6  5.8  5.0   Hemoglobin 12.0 - 15.0 g/dL 86.3  86.1  87.5   Hematocrit 36.0 - 46.0 % 42.0  42.9  38.9   Platelets 150 - 400 K/uL 111  112  105       Latest Ref Rng & Units 03/01/2024   11:25 AM 01/17/2023   11:17 AM 03/02/2022    2:50 PM  CMP  Glucose 70 - 99 mg/dL 74  94  837   BUN 6 - 20 mg/dL 13  11  8    Creatinine 0.44 - 1.00 mg/dL 9.38  9.12  9.05   Sodium 135 - 145 mmol/L 137  141  136   Potassium 3.5 - 5.1 mmol/L 4.1  5.0  4.5   Chloride 98 - 111 mmol/L 103  103  98   CO2 22 - 32  mmol/L 24  22  21    Calcium 8.9 - 10.3 mg/dL 9.5  9.7  9.2   Total Protein 6.5 - 8.1 g/dL 6.8  6.2  6.3   Total Bilirubin 0.0 - 1.2 mg/dL 0.6  0.4  0.5   Alkaline Phos 38 - 126 U/L 79  89  103   AST 15 - 41 U/L 20  20  10    ALT 0 - 44 U/L 17  19  9        RADIOGRAPHIC STUDIES: I have personally reviewed the radiological images as listed and agreed with the findings in the report. No results found.

## 2024-03-03 LAB — EPSTEIN BARR VRS(EBV DNA BY PCR): EBV DNA QN by PCR: NEGATIVE [IU]/mL

## 2024-03-04 LAB — IMMUNOGLOBULINS A/E/G/M, SERUM
IgA: <5 mg/dL — ABNORMAL LOW (ref 87–352)
IgE (Immunoglobulin E), Serum: <2 [IU]/mL — ABNORMAL LOW (ref 6–495)
IgG (Immunoglobin G), Serum: 225 mg/dL — ABNORMAL LOW (ref 586–1602)
IgM (Immunoglobulin M), Srm: 13 mg/dL — ABNORMAL LOW (ref 26–217)

## 2024-03-06 DIAGNOSIS — F411 Generalized anxiety disorder: Secondary | ICD-10-CM | POA: Diagnosis not present

## 2024-03-07 LAB — COMP PANEL: LEUKEMIA/LYMPHOMA

## 2024-03-13 LAB — BCR-ABL1 FISH
Cells Analyzed: 200
Cells Counted: 200

## 2024-03-13 LAB — JAK2 V617F RFX CALR/MPL/E12-15

## 2024-03-13 LAB — CALR +MPL + E12-E15  (REFLEX)

## 2024-03-20 DIAGNOSIS — F411 Generalized anxiety disorder: Secondary | ICD-10-CM | POA: Diagnosis not present

## 2024-03-30 ENCOUNTER — Encounter: Payer: Self-pay | Admitting: Oncology

## 2024-03-30 ENCOUNTER — Telehealth: Payer: Self-pay | Admitting: Oncology

## 2024-03-30 ENCOUNTER — Inpatient Hospital Stay: Attending: Oncology | Admitting: Oncology

## 2024-03-30 NOTE — Assessment & Plan Note (Deleted)
 Previous history of ITP responded to dexamethasone  40 mg daily for 4 days. It appears that ITP always respond very well to steroid treatments. Discussed about future options if recurrent ITP or refractory ITP.  These options include splenectomy, CD20 antibodies, TPO agonist.  Currently given that her ITP responses very well to steroids and no frequent recurrence.  I recommend continue observation and check blood work every 6 months.  She agrees with the plan.

## 2024-03-30 NOTE — Telephone Encounter (Signed)
 Pt no showed  MD appt on 03/30/2024. I called and left vm for pt to call back to r/s appts. No show letter also mailed out.

## 2024-04-05 ENCOUNTER — Encounter: Payer: Self-pay | Admitting: Urology

## 2024-04-09 DIAGNOSIS — F411 Generalized anxiety disorder: Secondary | ICD-10-CM | POA: Diagnosis not present

## 2024-05-08 DIAGNOSIS — F411 Generalized anxiety disorder: Secondary | ICD-10-CM | POA: Diagnosis not present

## 2024-05-18 ENCOUNTER — Telehealth: Payer: Self-pay

## 2024-05-18 NOTE — Telephone Encounter (Signed)
 Called patient to see if she has gotten the US  renal done yet

## 2024-05-21 NOTE — Progress Notes (Deleted)
 05/23/2024 11:56 AM   Destiny Phillips 02/07/2001 [id number]  Referring provider: Sharma Coyer, MD [address] [place],  [place] [address]  Urological history: 1. 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 coli -cranberry tablets BID -90 days nitrofurantoin  (started 07/2023)   No chief complaint on file.  HPI: Destiny Phillips is a 23 y.o. woman who presents today for repeat renal ultrasound report.   Previous records reviewed.   She is found to have mild right pelviectasis on a renal ultrasound in April but was ordered for microscopic hematuria.  They are having (1 to 7) or (8 or more) daytime voids,  they are having nocturia (1-2) or (3 or more) and urgency is (none, mild, strong, severe).   They are having (stress, urge or mixed incontinence.)    they are having urinary leakage (1-2 times weekly, 3 or more times weekly, 1-2 times daily and 3 or more times daily) They are using absorbent products for leakage (no, sometimes, always )   the type of products they use are (panty liners, absorbant pads, depends) *** daily.  They are not limiting fluids.  They are not engaging in toilet mapping  ***  UA ***  PVR ***  Serum creatinine (02/2024) 0.61, eGFR > 60  Hemoglobin A1c (06/2023) 5.3  PMH: Past Medical History:  Diagnosis Date   Idiopathic thrombocytopenic purpura (ITP) (HCC)    SVT (supraventricular tachycardia)    Urinary tract infection     Surgical History: Past Surgical History:  Procedure Laterality Date   BRONCHIAL BIOPSY  11/16/2021   Procedure: BRONCHIAL BIOPSIES;  Surgeon: Shelah Lamar RAMAN, MD;  Location: [facility] ENDOSCOPY;  Service: Pulmonary;;   BRONCHIAL BRUSHINGS  11/16/2021   Procedure: BRONCHIAL BRUSHINGS;  Surgeon: Shelah Lamar RAMAN, MD;  Location[facility] ENDOSCOPY;  Service: Pulmonary;;   BRONCHIAL NEEDLE ASPIRATION BIOPSY  11/16/2021   Procedure: BRONCHIAL NEEDLE ASPIRATION BIOPSIES;  Surgeon: Shelah Lamar RAMAN, MD;   Location: [facility] ENDOSCOPY;  Service: Pulmonary;;   BRONCHIAL WASHINGS  11/16/2021   Procedure: BRONCHIAL WASHINGS;  Surgeon: Shelah Lamar RAMAN, MD;  Location: [facility] ENDOSCOPY;  Service: Pulmonary;;   CARDIAC ELECTROPHYSIOLOGY STUDY AND ABLATION     at [facility]   VIDEO BRONCHOSCOPY WITH RADIAL ENDOBRONCHIAL ULTRASOUND  11/16/2021   Procedure: VIDEO BRONCHOSCOPY WITH RADIAL ENDOBRONCHIAL ULTRASOUND;  Surgeon: Shelah Lamar RAMAN, MD;  Location: [facility] ENDOSCOPY;  Service: Pulmonary;;    Home Medications:  Allergies as of 05/23/2024   No Known Allergies      Medication List        Accurate as of May 21, 2024 11:56 AM. If you have any questions, ask your nurse or doctor.          Align Prebiotic-Probiotic 5-1.25 MG-GM Chew Chew by mouth.   escitalopram  10 MG tablet Commonly known as: LEXAPRO  TAKE ONE TABLET BY MOUTH DAILY   hydrOXYzine  25 MG capsule Commonly known as: VISTARIL  Take 1 capsule (25 mg total) by mouth every 8 (eight) hours as needed for anxiety.   METHYLCOBALAMIN PO Take 1 mg by mouth. 2-3 capsules a day   verapamil  120 MG CR tablet Commonly known as: CALAN -SR Take 1 tablet (120 mg total) by mouth daily as needed (for complex migraine).        Allergies: No Known Allergies  Family History: Family History  Problem Relation Age of Onset   Healthy Mother    Healthy Father    Cancer Paternal Uncle  Breast cancer Maternal Grandmother    Diabetes Maternal Grandfather    Colon cancer Paternal Grandfather     Social History:  reports that she has never smoked. She has never used smokeless tobacco. She reports that she does not drink alcohol and does not use drugs.  ROS: Pertinent ROS in HPI  Physical Exam: There were no vitals taken for this visit.  Constitutional:  Well nourished. Alert and oriented, No acute distress. HEENT: [place] AT, moist mucus membranes.  Trachea midline, no masses. Cardiovascular: No clubbing, cyanosis, or edema. Respiratory: Normal respiratory  effort, no increased work of breathing. GU: No CVA tenderness.  No bladder fullness or masses.  Recession of labia minora, dry, pale vulvar vaginal mucosa and loss of mucosal ridges and folds.  Normal urethral meatus, no lesions, no prolapse, no discharge.   No urethral masses, tenderness and/or tenderness. No bladder fullness, tenderness or masses. *** vagina mucosa, *** estrogen effect, no discharge, no lesions, *** pelvic support, *** cystocele and *** rectocele noted.  No cervical motion tenderness.  Uterus is freely mobile and non-fixed.  No adnexal/parametria masses or tenderness noted.  Anus and perineum are without rashes or lesions.   ***  Neurologic: Grossly intact, no focal deficits, moving all 4 extremities. Psychiatric: Normal mood and affect.    Laboratory Data: See EPIC and HPI  I have reviewed the labs.   Pertinent Imaging: RUS pending    Assessment & Plan:    1. Right pelviectasis - RUS pending  2. rUTI's - UA ***  No follow-ups on file.  These notes generated with voice recognition software. I apologize for typographical errors.  Destiny Phillips  [facility] [address]  [address] [place], [place] [address] [phone]

## 2024-05-23 ENCOUNTER — Ambulatory Visit: Admitting: Urology

## 2024-05-23 DIAGNOSIS — N2889 Other specified disorders of kidney and ureter: Secondary | ICD-10-CM

## 2024-05-23 DIAGNOSIS — N390 Urinary tract infection, site not specified: Secondary | ICD-10-CM

## 2024-05-24 ENCOUNTER — Ambulatory Visit: Admitting: Urology

## 2024-05-25 ENCOUNTER — Ambulatory Visit
Admission: RE | Admit: 2024-05-25 | Discharge: 2024-05-25 | Disposition: A | Discharge status: Home / Self Care | Source: Ambulatory Visit | Attending: Urology | Admitting: Urology

## 2024-05-25 DIAGNOSIS — N1330 Unspecified hydronephrosis: Secondary | ICD-10-CM | POA: Diagnosis not present

## 2024-05-25 DIAGNOSIS — N2889 Other specified disorders of kidney and ureter: Secondary | ICD-10-CM | POA: Diagnosis not present

## 2024-05-25 DIAGNOSIS — N390 Urinary tract infection, site not specified: Secondary | ICD-10-CM | POA: Diagnosis not present

## 2024-05-25 DIAGNOSIS — N281 Cyst of kidney, acquired: Secondary | ICD-10-CM | POA: Diagnosis not present

## 2024-05-29 NOTE — Progress Notes (Deleted)
 05/31/2024 12:37 PM   Destiny Phillips 20-Mar-2001 [id number]  Referring provider: Sharma Coyer, MD [address] [place] [address],  [place] [address]  Urological history: 1. 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 coli -cranberry tablets BID -90 days nitrofurantoin  (started 07/2023)   No chief complaint on file.  HPI: Destiny Phillips is a 23 y.o. woman who presents today for repeat renal ultrasound report.   Previous records reviewed.   She is found to have mild right pelviectasis on a renal ultrasound in April but was ordered for microscopic hematuria.  They are having (1 to 7) or (8 or more) daytime voids,  they are having nocturia (1-2) or (3 or more) and urgency is (none, mild, strong, severe).   They are having (stress, urge or mixed incontinence.)    they are having urinary leakage (1-2 times weekly, 3 or more times weekly, 1-2 times daily and 3 or more times daily) They are using absorbent products for leakage (no, sometimes, always )   the type of products they use are (panty liners, absorbant pads, depends) *** daily.  They are not limiting fluids.  They are not engaging in toilet mapping  ***  UA ***  PVR ***  Serum creatinine (02/2024) 0.61, eGFR > 60  Hemoglobin A1c (06/2023) 5.3  PMH: Past Medical History:  Diagnosis Date   Idiopathic thrombocytopenic purpura (ITP) (HCC)    SVT (supraventricular tachycardia)    Urinary tract infection     Surgical History: Past Surgical History:  Procedure Laterality Date   BRONCHIAL BIOPSY  11/16/2021   Procedure: BRONCHIAL BIOPSIES;  Surgeon: Shelah Lamar RAMAN, MD;  Location: [facility] END[facility];  Service: Pulmonary;;   BRONCHIAL BRUSHINGS  11/16/2021   Procedure: BRONCHIAL BRUSHINGS;  Surgeon: Shelah Lamar RAMAN, MD;  Location: [facility] ENDOSCOPY;  Service: Pulmonary;;   BRONCHIAL NEEDLE ASPIRATION BIOPSY  11/16/2021   Procedure: BRONCHIAL NEEDLE ASPIRATION BIOPSIES;  Surgeon: Shelah Lamar RAMAN, MD;   Location: [facility] ENDOSCOPY;  Service: Pulmonary;;   BRONCHIAL WASHINGS  11/16/2021   Procedure: BRONCHIAL WASHINGS;  Surgeon: Shelah Lamar RAMAN, MD;  Location: [facility]OS[facility];  Service: Pulmonary;;   CARDIAC ELECTROPHYSIOLOGY STUDY AND ABLATION     at [facility]   VIDEO BRONCHOSCOPY WITH RADIAL ENDOBRONCHIAL ULTRASOUND  11/16/2021   Procedure: VIDEO BRONCHOSCOPY WITH RADIAL ENDOBRONCHIAL ULTRASOUND;  Surgeon: Shelah Lamar RAMAN, MD;  Location: [facility] ENDOS[facility];  Service: Pulmonary;;    Home Medications:  Allergies as of 05/31/2024   No Known Allergies      Medication List        Accurate as of May 29, 2024 12:37 PM. If you have any questions, ask your nurse or doctor.          Align Prebiotic-Probiotic 5-1.25 MG-GM Chew Chew by mouth.   escitalopram  10 MG tablet Commonly known as: LEXAPRO  TAKE ONE TABLET BY MOUTH DAILY   hydrOXYzine  25 MG capsule Commonly known as: VISTARIL  Take 1 capsule (25 mg total) by mouth every 8 (eight) hours as needed for anxiety.   METHYLCOBALAMIN PO Take 1 mg by mouth. 2-3 capsules a day   verapamil  120 MG CR tablet Commonly known as: CALAN -SR Take 1 tablet (120 mg total) by mouth daily as needed (for complex migraine).        Allergies: No Known Allergies  Family History: Family History  Problem Relation Age of Onset   Healthy Mother    Healthy Father    Cancer Paternal Uncle  Breast cancer Maternal Grandmother    Diabetes Maternal Grandfather    Colon cancer Paternal Grandfather     Social History:  reports that she has never smoked. She has never used smokeless tobacco. She reports that she does not drink alcohol and does not use drugs.  ROS: Pertinent ROS in HPI  Physical Exam: There were no vitals taken for this visit.  Constitutional:  Well nourished. Alert and oriented, No acute distress. HEENT: [place] AT, moist mucus membranes.  Trachea midline, no masses. Cardiovascular: No clubbing, cyanosis, or edema. Respiratory: Normal  respiratory effort, no increased work of breathing. GU: No CVA tenderness.  No bladder fullness or masses.  Recession of labia minora, dry, pale vulvar vaginal mucosa and loss of mucosal ridges and folds.  Normal urethral meatus, no lesions, no prolapse, no discharge.   No urethral masses, tenderness and/or tenderness. No bladder fullness, tenderness or masses. *** vagina mucosa, *** estrogen effect, no discharge, no lesions, *** pelvic support, *** cystocele and *** rectocele noted.  No cervical motion tenderness.  Uterus is freely mobile and non-fixed.  No adnexal/parametria masses or tenderness noted.  Anus and perineum are without rashes or lesions.   ***  Neurologic: Grossly intact, no focal deficits, moving all 4 extremities. Psychiatric: Normal mood and affect.    Laboratory Data: See EPIC and HPI  I have reviewed the labs.   Pertinent Imaging: RUS pending    Assessment & Plan:    1. Right pelviectasis - RUS pending  2. rUTI's - UA ***  No follow-ups on file.  These notes generated with voice recognition software. I apologize for typographical errors.  [name]  [facility] [address]  [address] [place], [place] [address] [phone]

## 2024-05-31 ENCOUNTER — Ambulatory Visit: Admitting: Urology

## 2024-06-01 ENCOUNTER — Ambulatory Visit
Admission: RE | Admit: 2024-06-01 | Discharge: 2024-06-01 | Disposition: A | Discharge status: Home / Self Care | Source: Ambulatory Visit | Attending: Oncology | Admitting: Oncology

## 2024-06-01 DIAGNOSIS — R161 Splenomegaly, not elsewhere classified: Secondary | ICD-10-CM | POA: Insufficient documentation

## 2024-06-01 DIAGNOSIS — D809 Immunodeficiency with predominantly antibody defects, unspecified: Secondary | ICD-10-CM | POA: Diagnosis not present

## 2024-06-03 NOTE — Progress Notes (Unsigned)
 06/05/2024 9:00 AM   Destiny Phillips [birth year] [id number]  Referring provider: Sharma Coyer, MD [address] [place],  [place] [address]  Urological history: 1. 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 coli -cranberry tablets BID -90 days nitrofurantoin  (started 07/2023)   2. Left renal cyst - RUS (05/2024) 1.1 cm simple cyst   Chief Complaint  Patient presents with   Recurrent UTI   HPI: Destiny Phillips is a 23 y.o. woman who presents today for repeat renal ultrasound report.   Previous records reviewed.   She is found to have mild right pelviectasis on a renal ultrasound in April, it was ordered for microscopic hematuria.  Repeat renal ultrasound shows the mild right pelviectasis has resolved.  She feels well today.  She does not have symptoms of UTI.  She is on her menstrual cycle however.  She is having 1-7 daytime voids, no nocturia, mild urge to urinate, no urinary leakage, she does not limit fluid intake and she does not engage in toilet mapping.  Patient denies any modifying or aggravating factors.  Patient denies any recent UTI's, gross hematuria, dysuria or suprapubic/flank pain.  Patient denies any fevers, chills, nausea or vomiting.    UA yellow clear, specific gravity 1.025, pH 6.0, 2+ heme, trace leukocyte, 0-5 WBCs, 3-10 RBCs, greater than 10 epithelial cells, amorphous sediment present, mucus threads present and moderate bacteria.  PVR 22 mL  Serum creatinine (02/2024) 0.61, eGFR > 60  Hemoglobin A1c (06/2023) 5.3  PMH: Past Medical History:  Diagnosis Date   Idiopathic thrombocytopenic purpura (ITP) (HCC)    SVT (supraventricular tachycardia)    Urinary tract infection     Surgical History: Past Surgical History:  Procedure Laterality Date   BRONCHIAL BIOPSY  11/16/2021   Procedure: BRONCHIAL BIOPSIES;  Surgeon: Shelah Lamar RAMAN, MD;  Location: [facility] ENDOSCOPY;  Service: Pulmonary;;   BRONCHIAL  BRUSHINGS  11/16/2021   Procedure: BRONCHIAL BRUSHINGS;  Surgeon: Shelah Lamar RAMAN, MD;  Location: [facility] ENDOSCOPY;  Service: Pulmonary;;   BRONCHIAL NEEDLE ASPIRATION BIOPSY  11/16/2021   Procedure: BRONCHIAL NEEDLE ASPIRATION BIOPSIES;  Surgeon: Shelah Lamar RAMAN, MD;  Location: [facility] ENDOSCOPY;  Service: Pulmonary;;   BRONCHIAL WASHINGS  11/16/2021   Procedure: BRONCHIAL WASHINGS;  Surgeon: Shelah Lamar RAMAN, MD;  Location: [facility] ENDOSCOPY;  Service: Pulmonary;;   CARDIAC ELECTROPHYSIOLOGY STUDY AND ABLATION     at [facility]   VIDEO BRONCHOSCOPY WITH RADIAL ENDOBRONCHIAL ULTRASOUND  11/16/2021   Procedure: VIDEO BRONCHOSCOPY WITH RADIAL ENDOBRONCHIAL ULTRASOUND;  Surgeon: Shelah Lamar RAMAN, MD;  Location: [facility] ENDOSCOPY;  Service: Pulmonary;;    Home Medications:  Allergies as of 06/05/2024   No Known Allergies      Medication List        Accurate as of June 05, 2024  9:00 AM. If you have any questions, ask your nurse or doctor.          STOP taking these medications    escitalopram  10 MG tablet Commonly known as: LEXAPRO  Stopped by: CLOTILDA CORNWALL   hydrOXYzine  25 MG capsule Commonly known as: VISTARIL  Stopped by: CLOTILDA Judd Mccubbin       TAKE these medications    Align Prebiotic-Probiotic 5-1.25 MG-GM Chew Chew by mouth.   METHYLCOBALAMIN PO Take 1 mg by mouth. 2-3 capsules a day   verapamil  120 MG CR tablet Commonly known as: CALAN -SR Take 1 tablet (120 mg total) by mouth daily as needed (for complex migraine).  Allergies: No Known Allergies  Family History: Family History  Problem Relation Age of Onset   Healthy Mother    Healthy Father    Cancer Paternal Uncle    Breast cancer Maternal Grandmother    Diabetes Maternal Grandfather    Colon cancer Paternal Grandfather     Social History:  reports that she has never smoked. She has never used smokeless tobacco. She reports that she does not drink alcohol and does not use drugs.  ROS: Pertinent ROS in  HPI  Physical Exam: BP 101/68   Pulse (!) 102   Ht 5' 3 (1.6 m)   Wt 110 lb (49.9 kg)   BMI 19.49 kg/m   Constitutional:  Well nourished. Alert and oriented, No acute distress. HEENT: [place] AT, moist mucus membranes.  Trachea midline Cardiovascular: No clubbing, cyanosis, or edema. Respiratory: Normal respiratory effort, no increased work of breathing. Neurologic: Grossly intact, no focal deficits, moving all 4 extremities. Psychiatric: Normal mood and affect.    Laboratory Data: See EPIC and HPI  I have reviewed the labs.   Pertinent Imaging: CLINICAL DATA:  Follow-up examination for right pelviectasis.   EXAM: RENAL / URINARY TRACT ULTRASOUND COMPLETE   COMPARISON:  Prior ultrasound from 11/11/2023   FINDINGS: Right Kidney:   Renal measurements: 10.8 x 3.7 x 4.5 cm = volume: 94.7 mL. Renal echogenicity within normal limits. No nephrolithiasis. No significant pelviectasis or hydronephrosis seen on today's exam. No focal renal mass.   Left Kidney:   Renal measurements: 11.1 x 3.0 x 4.9 cm = volume: 86.5 mL. Renal echogenicity within normal limits. No nephrolithiasis or hydronephrosis. 1.1 cm simple cyst present at the upper pole, stable.   Bladder:   Appears normal for degree of bladder distention. Both jets are visualized.   Other:   None.   IMPRESSION: 1. Negative renal ultrasound. No significant pelviectasis or hydronephrosis seen on today's exam. 2. 1.1 cm simple cyst at the upper pole of the left kidney, stable from prior. This is benign in appearance, with no follow-up imaging recommended regarding this lesion.     Electronically Signed   By: Morene Hoard M.D.   On: 05/29/2024 05:32  I have independently reviewed the films.  See HPI.     Assessment & Plan:    1. Right pelviectasis - RUS shows the right pelviectasis has resolved   2. rUTI's - UA w/ micro heme, but patient is menstrual cycle - she will return in 2 weeks for repeat UA  to ensure the microscopic hematuria does not persist when she is not having her menstrual cycle, she will need to reschedule this appointment if she starts her menstrual cycle again as she finds them to be irregular  Return in about 2 weeks (around 06/19/2024) for repeat UA only.  These notes generated with voice recognition software. I apologize for typographical errors.  [name]  [facility] [address]  [address] [place], [place] [address] [phone]

## 2024-06-05 ENCOUNTER — Encounter: Payer: Self-pay | Admitting: Urology

## 2024-06-05 ENCOUNTER — Ambulatory Visit: Admitting: Urology

## 2024-06-05 VITALS — BP 101/68 | HR 102 | Ht 63.0 in | Wt 110.0 lb

## 2024-06-05 DIAGNOSIS — N390 Urinary tract infection, site not specified: Secondary | ICD-10-CM | POA: Diagnosis not present

## 2024-06-05 DIAGNOSIS — N2889 Other specified disorders of kidney and ureter: Secondary | ICD-10-CM

## 2024-06-05 LAB — URINALYSIS, COMPLETE
Bilirubin, UA: NEGATIVE
Glucose, UA: NEGATIVE
Ketones, UA: NEGATIVE
Nitrite, UA: NEGATIVE
Protein,UA: NEGATIVE
Specific Gravity, UA: 1.025 (ref 1.005–1.030)
Urobilinogen, Ur: 0.2 mg/dL (ref 0.2–1.0)
pH, UA: 6 (ref 5.0–7.5)

## 2024-06-05 LAB — BLADDER SCAN AMB NON-IMAGING

## 2024-06-05 LAB — MICROSCOPIC EXAMINATION: Epithelial Cells (non renal): >10 /HPF — AB (ref 0–10)

## 2024-06-12 DIAGNOSIS — F411 Generalized anxiety disorder: Secondary | ICD-10-CM | POA: Diagnosis not present

## 2024-06-19 ENCOUNTER — Other Ambulatory Visit

## 2024-06-19 DIAGNOSIS — Z124 Encounter for screening for malignant neoplasm of cervix: Secondary | ICD-10-CM | POA: Diagnosis not present

## 2024-06-19 DIAGNOSIS — R82998 Other abnormal findings in urine: Secondary | ICD-10-CM | POA: Diagnosis not present

## 2024-06-19 DIAGNOSIS — Z681 Body mass index (BMI) 19 or less, adult: Secondary | ICD-10-CM | POA: Diagnosis not present

## 2024-06-19 DIAGNOSIS — Z01419 Encounter for gynecological examination (general) (routine) without abnormal findings: Secondary | ICD-10-CM | POA: Diagnosis not present

## 2024-06-19 DIAGNOSIS — R87810 Cervical high risk human papillomavirus (HPV) DNA test positive: Secondary | ICD-10-CM | POA: Diagnosis not present

## 2024-06-19 DIAGNOSIS — Z113 Encounter for screening for infections with a predominantly sexual mode of transmission: Secondary | ICD-10-CM | POA: Diagnosis not present

## 2024-06-20 ENCOUNTER — Other Ambulatory Visit: Payer: Self-pay

## 2024-06-20 DIAGNOSIS — N390 Urinary tract infection, site not specified: Secondary | ICD-10-CM

## 2024-06-20 LAB — MICROSCOPIC EXAMINATION: Epithelial Cells (non renal): >10 /HPF — AB (ref 0–10)

## 2024-06-20 LAB — URINALYSIS, COMPLETE
Bilirubin, UA: NEGATIVE
Glucose, UA: NEGATIVE
Ketones, UA: NEGATIVE
Nitrite, UA: NEGATIVE
Protein,UA: NEGATIVE
RBC, UA: NEGATIVE
Specific Gravity, UA: <1.005 — ABNORMAL LOW (ref 1.005–1.030)
Urobilinogen, Ur: 0.2 mg/dL (ref 0.2–1.0)
pH, UA: 6 (ref 5.0–7.5)

## 2024-06-21 ENCOUNTER — Ambulatory Visit: Payer: Self-pay | Admitting: Urology

## 2024-06-21 ENCOUNTER — Ambulatory Visit: Admitting: Family Medicine

## 2024-06-26 DIAGNOSIS — F411 Generalized anxiety disorder: Secondary | ICD-10-CM | POA: Diagnosis not present

## 2024-07-09 DIAGNOSIS — F411 Generalized anxiety disorder: Secondary | ICD-10-CM | POA: Diagnosis not present

## 2024-07-23 ENCOUNTER — Other Ambulatory Visit: Payer: Self-pay

## 2024-07-23 ENCOUNTER — Encounter: Payer: Self-pay | Admitting: Oncology

## 2024-07-23 ENCOUNTER — Inpatient Hospital Stay: Payer: Self-pay | Attending: Oncology | Admitting: Oncology

## 2024-07-23 ENCOUNTER — Inpatient Hospital Stay

## 2024-07-23 VITALS — BP 108/75 | HR 75 | Temp 97.0°F | Resp 18 | Wt 111.3 lb

## 2024-07-23 DIAGNOSIS — D693 Immune thrombocytopenic purpura: Secondary | ICD-10-CM | POA: Diagnosis not present

## 2024-07-23 DIAGNOSIS — R161 Splenomegaly, not elsewhere classified: Secondary | ICD-10-CM | POA: Diagnosis not present

## 2024-07-23 DIAGNOSIS — D809 Immunodeficiency with predominantly antibody defects, unspecified: Secondary | ICD-10-CM

## 2024-07-23 LAB — CBC WITH DIFFERENTIAL/PLATELET
Abs Immature Granulocytes: 0.02 K/uL (ref 0.00–0.07)
Basophils Absolute: 0 K/uL (ref 0.0–0.1)
Basophils Relative: 1 %
Eosinophils Absolute: 0.1 K/uL (ref 0.0–0.5)
Eosinophils Relative: 3 %
HCT: 39 % (ref 36.0–46.0)
Hemoglobin: 12.6 g/dL (ref 12.0–15.0)
Immature Granulocytes: 0 %
Lymphocytes Relative: 33 %
Lymphs Abs: 1.5 K/uL (ref 0.7–4.0)
MCH: 25.6 pg — ABNORMAL LOW (ref 26.0–34.0)
MCHC: 32.3 g/dL (ref 30.0–36.0)
MCV: 79.3 fL — ABNORMAL LOW (ref 80.0–100.0)
Monocytes Absolute: 0.4 K/uL (ref 0.1–1.0)
Monocytes Relative: 8 %
Neutro Abs: 2.6 K/uL (ref 1.7–7.7)
Neutrophils Relative %: 55 %
Platelets: 120 K/uL — ABNORMAL LOW (ref 150–400)
RBC: 4.92 MIL/uL (ref 3.87–5.11)
RDW: 13.3 % (ref 11.5–15.5)
WBC: 4.6 K/uL (ref 4.0–10.5)
nRBC: 0 % (ref 0.0–0.2)

## 2024-07-23 LAB — COMPREHENSIVE METABOLIC PANEL WITH GFR
ALT: 17 U/L (ref 0–44)
AST: 22 U/L (ref 15–41)
Albumin: 5.2 g/dL — ABNORMAL HIGH (ref 3.5–5.0)
Alkaline Phosphatase: 85 U/L (ref 38–126)
Anion gap: 10 (ref 5–15)
BUN: 14 mg/dL (ref 6–20)
CO2: 26 mmol/L (ref 22–32)
Calcium: 10.1 mg/dL (ref 8.9–10.3)
Chloride: 100 mmol/L (ref 98–111)
Creatinine, Ser: 0.91 mg/dL (ref 0.44–1.00)
GFR, Estimated: >60 mL/min
Glucose, Bld: 93 mg/dL (ref 70–99)
Potassium: 4.6 mmol/L (ref 3.5–5.1)
Sodium: 136 mmol/L (ref 135–145)
Total Bilirubin: 0.4 mg/dL (ref 0.0–1.2)
Total Protein: 7.1 g/dL (ref 6.5–8.1)

## 2024-07-23 LAB — FOLATE: Folate: 8.2 ng/mL

## 2024-07-23 LAB — IMMATURE PLATELET FRACTION: Immature Platelet Fraction: 0.7 % — ABNORMAL LOW (ref 1.2–8.6)

## 2024-07-23 LAB — VITAMIN B12: Vitamin B-12: 1020 pg/mL — ABNORMAL HIGH (ref 180–914)

## 2024-07-23 NOTE — Assessment & Plan Note (Signed)
 Previous history of ITP responded to dexamethasone  40 mg daily for 4 days. It appears that ITP always responds very well to steroid treatments. Discussed about future options if recurrent ITP or refractory ITP.  These options include splenectomy, CD20 antibodies, TPO agonist.  Currently her platelet count is stable.   I recommend continue observation and check blood work every 6 months.  She agrees with the plan.  Immature platelet fraction is low. Indicate decreased production. Platelet count is stable. Close monitor.

## 2024-07-23 NOTE — Assessment & Plan Note (Signed)
Recommend patient to establish care with immunology for further evaluation.  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

## 2024-07-23 NOTE — Progress Notes (Signed)
 " Hematology/Oncology Progress note [phone] [phone]            Patient Care Team: Sharma Coyer, MD as PCP - General (Family Medicine) Babara Call, MD as Consulting Physician (Oncology)  REFERRING PROVIDER: Sharma Coyer, MD   CHIEF COMPLAINTS/REASON FOR VISIT:  Thrombocytopenia, ITP, immunoglobulin deficiency  ASSESSMENT & PLAN:   Chronic ITP (idiopathic thrombocytopenia) (HCC) Previous history of ITP responded to dexamethasone  40 mg daily for 4 days. It appears that ITP always responds very well to steroid treatments. Discussed about future options if recurrent ITP or refractory ITP.  These options include splenectomy, CD20 antibodies, TPO agonist.  Currently her platelet count is stable.   I recommend continue observation and check blood work every 6 months.  She agrees with the plan.  Immature platelet fraction is low. Indicate decreased production. Platelet count is stable. Close monitor.    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 Negative for  BCR-ABL 1 FISH, JAK2 mutation with reflex, negative EBV, negative  flow cytometry.   Orders Placed This Encounter  Procedures   Folate    Standing Status:   Future    Number of Occurrences:   1    Expected Date:   07/23/2024    Expiration Date:   10/21/2024   Vitamin B12    Standing Status:   Future    Number of Occurrences:   1    Expected Date:   07/23/2024    Expiration Date:   10/21/2024   CBC with Differential/Platelet    Standing Status:   Future    Number of Occurrences:   1    Expected Date:   07/23/2024    Expiration Date:   10/21/2024   Immature Platelet Fraction    Standing Status:   Future    Number of Occurrences:   1    Expected Date:   07/23/2024    Expiration Date:   10/21/2024   ANA, IFA (with reflex)    Standing Status:   Future     Number of Occurrences:   1    Expected Date:   07/23/2024    Expiration Date:   10/21/2024   Comprehensive metabolic panel with GFR    Standing Status:   Future    Number of Occurrences:   1    Expected Date:   07/23/2024    Expiration Date:   07/23/2025   CMP (Cancer Center only)    Standing Status:   Future    Expected Date:   01/20/2025    Expiration Date:   04/20/2025   CBC with Differential (Cancer Center Only)    Standing Status:   Future    Expected Date:   01/20/2025    Expiration Date:   04/20/2025   Ambulatory referral to Immunology    Referral Priority:   Routine    Referral Type:   Consultation    Referral Reason:   Specialty Services Required    Requested Specialty:   Immunology    Number of Visits Requested:   1   Follow-up in 6 months All questions were answered. The patient knows to call the clinic with any problems, questions or concerns.  Call Babara, MD, PhD [facility] Hematology Oncology 07/23/2024   HISTORY OF PRESENTING ILLNESS:   Destiny Phillips is a  24 y.o.  female with PMH listed below was seen in consultation at  the request of  Simmons-Robinson, Rockie, MD  for evaluation of ITP Patient has a history of chronic ITP Previously seen by pediatric hematologist at [facility].  Reviewed previous hematologist note.  She was last seen in 2018 there.  History of acute ITP, she has previously tried IVIG and it did not response.  She responded to steroids well.  ITP always resolves after steroid treatments. She had 1 episode as a young child, then in 2010, and 2017.  Recently she had another acute ITP flare with platelet count dropped to 18,000.  She has noticed petechiae rash.  Her primary care provider prescribed dexamethasone  40 mg daily for 4 days and a follow-up CBC showed a platelet count of 134,000.  Patient was referred to establish care with hematology.  May 2023, patient was seen by [facility] pulmonologist Dr.Meier for patchy airspace opacities in her  lungs..  Patient underwent extensive pulmonology workup she had bronchoscopy biopsy-negative for malignancy.  ANA is negative it was noted that her immunoglobulin levels were decreased-low IgA, IgG and IgM..  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  She has not established care yet.   INTERVAL HISTORY Destiny Phillips is a 24 y.o. female who has above history reviewed by me today presents for follow up visit for thrombocytopenia, splenomegaly, immunoglobulin deficiency.  Patient presents to follow up. She reports feeling well. No active bleeding she has not established care with immunology yet. She denies recurrent infection.  Father recently was diagnosed with IgG4 related disease.   MEDICAL HISTORY:  Past Medical History:  Diagnosis Date   Idiopathic thrombocytopenic purpura (ITP) (HCC)    SVT (supraventricular tachycardia)    Urinary tract infection     SURGICAL HISTORY: Past Surgical History:  Procedure Laterality Date   BRONCHIAL BIOPSY  11/16/2021   Procedure: BRONCHIAL BIOPSIES;  Surgeon: Shelah Lamar RAMAN, MD;  Location: [facility];  Service: Pulmonary;;   BRONCHIAL BRUSHINGS  11/16/2021   Procedure: BRONCHIAL BRUSHINGS;  Surgeon: Shelah Lamar RAMAN, MD;  Location: [facility] END[facility]COPY;  Service: Pulmonary;;   BRONCHIAL NEEDLE ASPIRATION BIOPSY  11/16/2021   Procedure: BRONCHIAL NEEDLE ASPIRATION BIOPSIES;  Surgeon: Shelah Lamar RAMAN, MD;  Location: [facility] ENDOSCOPY;  Service: Pulmonary;;   BRONCHIAL WASHINGS  11/16/2021   Procedure: BRONCHIAL WASHINGS;  Surgeon: Shelah Lamar RAMAN, MD;  Location: [facility] ENDOSCOPY;  Service: Pulmonary;;   CARDIAC ELECTROPHYSIOLOGY STUDY AND ABLATION     at [facility]   VIDEO BRONCHOSCOPY WITH RADIAL ENDOBRONCHIAL ULTRASOUND  11/16/2021   Procedure: VIDEO BRONCHOSCOPY WITH RADIAL ENDOBRONCHIAL ULTRASOUND;  Surgeon: Shelah Lamar RAMAN, MD;  Location: [facility] ENDOSCOPY;  Service: Pulmonary;;    SOCIAL HISTORY: Social History   Socioeconomic History    Marital status: Single    Spouse name: Not on file   Number of children: Not on file   Years of education: Not on file   Highest education level: Not on file  Occupational History   Not on file  Tobacco Use   Smoking status: Never   Smokeless tobacco: Never  Substance and Sexual Activity   Alcohol use: Never   Drug use: Never   Sexual activity: Yes    Birth control/protection: I.U.D.  Other Topics Concern   Not on file  Social History Narrative   Not on file   Social Drivers of Health   Tobacco Use: Low Risk (07/23/2024)   Patient History    Smoking Tobacco Use: Never    Smokeless  Tobacco Use: Never    Passive Exposure: Not on file  Financial Resource Strain: Low Risk (11/30/2022)   Overall Financial Resource Strain (CARDIA)    Difficulty of Paying Living Expenses: Not hard at all  Food Insecurity: No Food Insecurity (11/30/2022)   Hunger Vital Sign    Worried About Running Out of Food in the Last Year: Never true    Ran Out of Food in the Last Year: Never true  Transportation Needs: Not on file  Physical Activity: Sufficiently Active (11/30/2022)   Exercise Vital Sign    Days of Exercise per Week: 5 days    Minutes of Exercise per Session: 40 min  Stress: Stress Concern Present (11/30/2022)   [employer] of Occupational Health - Occupational Stress Questionnaire    Feeling of Stress : Rather much  Social Connections: Moderately Integrated (11/30/2022)   Social Connection and Isolation Panel    Frequency of Communication with Friends and Family: More than three times a week    Frequency of Social Gatherings with Friends and Family: More than three times a week    Attends Religious Services: More than 4 times per year    Active Member of Clubs or Organizations: Yes    Attends Banker Meetings: More than 4 times per year    Marital Status: Never married  Intimate Partner Violence: Not on file  Depression (PHQ2-9): Low Risk (03/01/2024)   Depression  (PHQ2-9)    PHQ-2 Score: 0  Recent Concern: Depression (PHQ2-9) - Medium Risk (02/20/2024)   Depression (PHQ2-9)    PHQ-2 Score: 7  Alcohol Screen: Low Risk (01/17/2023)   Alcohol Screen    Last Alcohol Screening Score (AUDIT): 1  Housing: Low Risk (11/30/2022)   Housing    Last Housing Risk Score: 0  Utilities: Not on file  Health Literacy: Not on file    FAMILY HISTORY: Family History  Problem Relation Age of Onset   Healthy Mother    Healthy Father    Cancer Paternal Uncle    Breast cancer Maternal Grandmother    Diabetes Maternal Grandfather    Colon cancer Paternal Grandfather     ALLERGIES:  has no known allergies.  MEDICATIONS:  Current Outpatient Medications  Medication Sig Dispense Refill   Bacillus Coagulans-Inulin (ALIGN PREBIOTIC-PROBIOTIC) 5-1.25 MG-GM CHEW Chew by mouth.     levonorgestrel (KYLEENA) 19.5 MG IUD Provided by [facility]     No current facility-administered medications for this visit.    Review of Systems  Constitutional:  Negative for appetite change, chills, fatigue and fever.  HENT:   Negative for hearing loss and voice change.   Eyes:  Negative for eye problems.  Respiratory:  Negative for chest tightness and cough.   Cardiovascular:  Negative for chest pain.  Gastrointestinal:  Negative for abdominal distention, abdominal pain and blood in stool.  Endocrine: Negative for hot flashes.  Genitourinary:  Negative for difficulty urinating and frequency.   Musculoskeletal:  Negative for arthralgias.  Skin:  Negative for itching and rash.  Neurological:  Negative for extremity weakness.  Hematological:  Negative for adenopathy. Bruises/bleeds easily.  Psychiatric/Behavioral:  Negative for confusion.    PHYSICAL EXAMINATION: Vitals:   07/23/24 1332  BP: 108/75  Pulse: 75  Resp: 18  Temp: (!) 97 F (36.1 C)   Filed Weights   07/23/24 1332  Weight: 111 lb 4.8 oz (50.5 kg)    Physical Exam Constitutional:      General: She is not in  acute  distress. HENT:     Head: Normocephalic and atraumatic.  Eyes:     General: No scleral icterus. Cardiovascular:     Rate and Rhythm: Normal rate and regular rhythm.     Heart sounds: Normal heart sounds.  Pulmonary:     Effort: Pulmonary effort is normal. No respiratory distress.     Breath sounds: No wheezing.  Abdominal:     General: Bowel sounds are normal. There is no distension.     Palpations: Abdomen is soft.  Musculoskeletal:        General: No deformity. Normal range of motion.     Cervical back: Normal range of motion and neck supple.  Skin:    General: Skin is warm and dry.     Findings: No erythema or rash.  Neurological:     Mental Status: She is alert and oriented to person, place, and time. Mental status is at baseline.     Cranial Nerves: No cranial nerve deficit.     Coordination: Coordination normal.  Psychiatric:        Mood and Affect: Mood normal.     LABORATORY DATA:  I have reviewed the data as listed    Latest Ref Rng & Units 07/23/2024    2:04 PM 03/01/2024   11:25 AM 06/30/2023   10:03 AM  CBC  WBC 4.0 - 10.5 K/uL 4.6  5.6  5.8   Hemoglobin 12.0 - 15.0 g/dL 87.3  86.3  86.1   Hematocrit 36.0 - 46.0 % 39.0  42.0  42.9   Platelets 150 - 400 K/uL 120  111  112       Latest Ref Rng & Units 07/23/2024    2:04 PM 03/01/2024   11:25 AM 01/17/2023   11:17 AM  CMP  Glucose 70 - 99 mg/dL 93  74  94   BUN 6 - 20 mg/dL 14  13  11    Creatinine 0.44 - 1.00 mg/dL 9.08  9.38  9.12   Sodium 135 - 145 mmol/L 136  137  141   Potassium 3.5 - 5.1 mmol/L 4.6  4.1  5.0   Chloride 98 - 111 mmol/L 100  103  103   CO2 22 - 32 mmol/L 26  24  22    Calcium 8.9 - 10.3 mg/dL 89.8  9.5  9.7   Total Protein 6.5 - 8.1 g/dL 7.1  6.8  6.2   Total Bilirubin 0.0 - 1.2 mg/dL 0.4  0.6  0.4   Alkaline Phos 38 - 126 U/L 85  79  89   AST 15 - 41 U/L 22  20  20    ALT 0 - 44 U/L 17  17  19        RADIOGRAPHIC STUDIES: I have personally reviewed the radiological images as  listed and agreed with the findings in the report. US  Abdomen Complete Result Date: 06/01/2024 CLINICAL DATA:  splenomegaly , thormbocytopenia EXAM: ABDOMEN ULTRASOUND COMPLETE COMPARISON:  May 25, 2024 FINDINGS: Gallbladder: No gallstones or wall thickening visualized. No sonographic Murphy sign noted by sonographer. Common bile duct: Diameter: Visualized portion measures 3 mm, within normal limits. Liver: No focal lesion identified. Within normal limits in parenchymal echogenicity. Portal vein is patent on color Doppler imaging with normal direction of blood flow towards the liver. IVC: No abnormality visualized. Pancreas: Visualized portion unremarkable. Spleen: Spleen enlarged spanning 16.0 by 15.4 x 6.4 cm for an estimated volume of 822 ML. Right Kidney: Length: 10.4 cm. Echogenicity within normal limits.  No mass or hydronephrosis visualized. Left Kidney: Length: 10.6 cm. Echogenicity within normal limits. No mass or hydronephrosis visualized. Abdominal aorta: No aneurysm visualized. Other findings: None. IMPRESSION: Splenomegaly. Electronically Signed   By: Corean Salter M.D.   On: 06/01/2024 14:34   US  RENAL Result Date: 05/29/2024 CLINICAL DATA:  Follow-up examination for right pelviectasis. EXAM: RENAL / URINARY TRACT ULTRASOUND COMPLETE COMPARISON:  Prior ultrasound from 11/11/2023 FINDINGS: Right Kidney: Renal measurements: 10.8 x 3.7 x 4.5 cm = volume: 94.7 mL. Renal echogenicity within normal limits. No nephrolithiasis. No significant pelviectasis or hydronephrosis seen on today's exam. No focal renal mass. Left Kidney: Renal measurements: 11.1 x 3.0 x 4.9 cm = volume: 86.5 mL. Renal echogenicity within normal limits. No nephrolithiasis or hydronephrosis. 1.1 cm simple cyst present at the upper pole, stable. Bladder: Appears normal for degree of bladder distention. Both jets are visualized. Other: None. IMPRESSION: 1. Negative renal ultrasound. No significant pelviectasis or  hydronephrosis seen on today's exam. 2. 1.1 cm simple cyst at the upper pole of the left kidney, stable from prior. This is benign in appearance, with no follow-up imaging recommended regarding this lesion. Electronically Signed   By: Morene Hoard M.D.   On: 05/29/2024 05:32      "

## 2024-07-23 NOTE — Assessment & Plan Note (Signed)
 Newly discovered splenomegaly, 16 cm. Negative for  BCR-ABL 1 FISH, JAK2 mutation with reflex, negative EBV, negative  flow cytometry.

## 2024-07-24 LAB — ANTINUCLEAR ANTIBODIES, IFA: ANA Ab, IFA: NEGATIVE

## 2024-08-10 ENCOUNTER — Ambulatory Visit: Payer: Self-pay

## 2024-08-10 NOTE — Telephone Encounter (Signed)
" °  FYI Only or Action Required?: FYI only for provider: home care advised.  Patient was last seen in primary care on 02/20/2024 by Sharma Coyer, MD.  Called Nurse Triage reporting Nasal Congestion.  Symptoms began several days ago.  Interventions attempted: OTC medications: mucinex.  Symptoms are: rapidly improving.  Triage Disposition: Home Care  Patient/caregiver understands and will follow disposition?: Yes    Reason for Triage: patient said she has been having chest pain along with chest congestion. Pain has radiated to her back. This has been going on since Tuesday    Reason for Disposition  Common cold with no complications  Answer Assessment - Initial Assessment Questions 1. ONSET: When did the cough and congestion start?      Tuesday   2. AMOUNT: How much discharge is there?      Not very much today  3. COUGH: Do you have a cough? If Yes, ask: Describe the color of your mucus. (e.g., clear, white, yellow, green)     Yes, a little bit green and normal  4. RESPIRATORY DISTRESS: any shortness of breath?     Denies  5. FEVER: Do you have a fever?      Denies  6. SEVERITY: Overall, how bad are you feeling right now? (e.g., doesn't interfere with normal activities, staying home from school/work, staying in bed)      Better today  7. OTHER SYMPTOMS: Do you have any other symptoms? (e.g., earache, mouth sores, sore throat, wheezing)     It hurts to breathe in, but it's definitely better today  Protocols used: Common Cold-A-AH  "

## 2025-01-23 ENCOUNTER — Inpatient Hospital Stay: Payer: Self-pay | Admitting: Oncology

## 2025-01-23 ENCOUNTER — Inpatient Hospital Stay: Payer: Self-pay
